# Patient Record
Sex: Male | Born: 1965 | Race: White | Hispanic: No | Marital: Married | Smoking: Former smoker
Health system: Southern US, Community
[De-identification: ages and names within clinical notes are randomized; demographics above are authoritative.]

## PROBLEM LIST (undated history)

## (undated) DIAGNOSIS — C9201 Acute myeloblastic leukemia, in remission: Secondary | ICD-10-CM

## (undated) DIAGNOSIS — D649 Anemia, unspecified: Secondary | ICD-10-CM

## (undated) DIAGNOSIS — N182 Chronic kidney disease, stage 2 (mild): Secondary | ICD-10-CM

## (undated) DIAGNOSIS — H409 Unspecified glaucoma: Secondary | ICD-10-CM

## (undated) DIAGNOSIS — I10 Essential (primary) hypertension: Secondary | ICD-10-CM

## (undated) HISTORY — PX: GLAUCOMA SURGERY: SHX656

## (undated) HISTORY — DX: Chronic kidney disease, stage 2 (mild): N18.2

## (undated) HISTORY — DX: Acute myeloblastic leukemia, in remission: C92.01

## (undated) HISTORY — DX: Essential (primary) hypertension: I10

## (undated) HISTORY — DX: Unspecified glaucoma: H40.9

---

## 2001-02-10 ENCOUNTER — Ambulatory Visit (HOSPITAL_COMMUNITY): Admission: RE | Admit: 2001-02-10 | Discharge: 2001-02-10 | Payer: Self-pay | Admitting: Family Medicine

## 2001-02-10 ENCOUNTER — Encounter: Payer: Self-pay | Admitting: Family Medicine

## 2006-05-25 ENCOUNTER — Ambulatory Visit: Payer: Self-pay | Admitting: Cardiology

## 2006-05-30 ENCOUNTER — Ambulatory Visit: Payer: Self-pay

## 2006-05-30 ENCOUNTER — Ambulatory Visit: Payer: Self-pay | Admitting: Cardiology

## 2006-06-14 ENCOUNTER — Emergency Department (HOSPITAL_COMMUNITY): Admission: EM | Admit: 2006-06-14 | Discharge: 2006-06-14 | Payer: Self-pay | Admitting: Emergency Medicine

## 2006-06-29 ENCOUNTER — Ambulatory Visit (HOSPITAL_COMMUNITY): Admission: RE | Admit: 2006-06-29 | Discharge: 2006-06-29 | Payer: Self-pay | Admitting: Family Medicine

## 2006-07-21 ENCOUNTER — Ambulatory Visit: Payer: Self-pay | Admitting: Cardiology

## 2007-11-05 IMAGING — CR DG CERVICAL SPINE COMPLETE 4+V
6 series · 6 of 6 positions shown · non-contrast
Comparison: none

HISTORY: Neck pain, chest pain, numbness tingling and pain left arm

CHEST 2 VIEWS:
No prior exam for comparison.
Normal heart size, mediastinal contours, and pulmonary vascularity.
Lungs clear.
No pleural effusion or pneumothorax.
Bones unremarkable.

[view not recorded (1 of 6)]
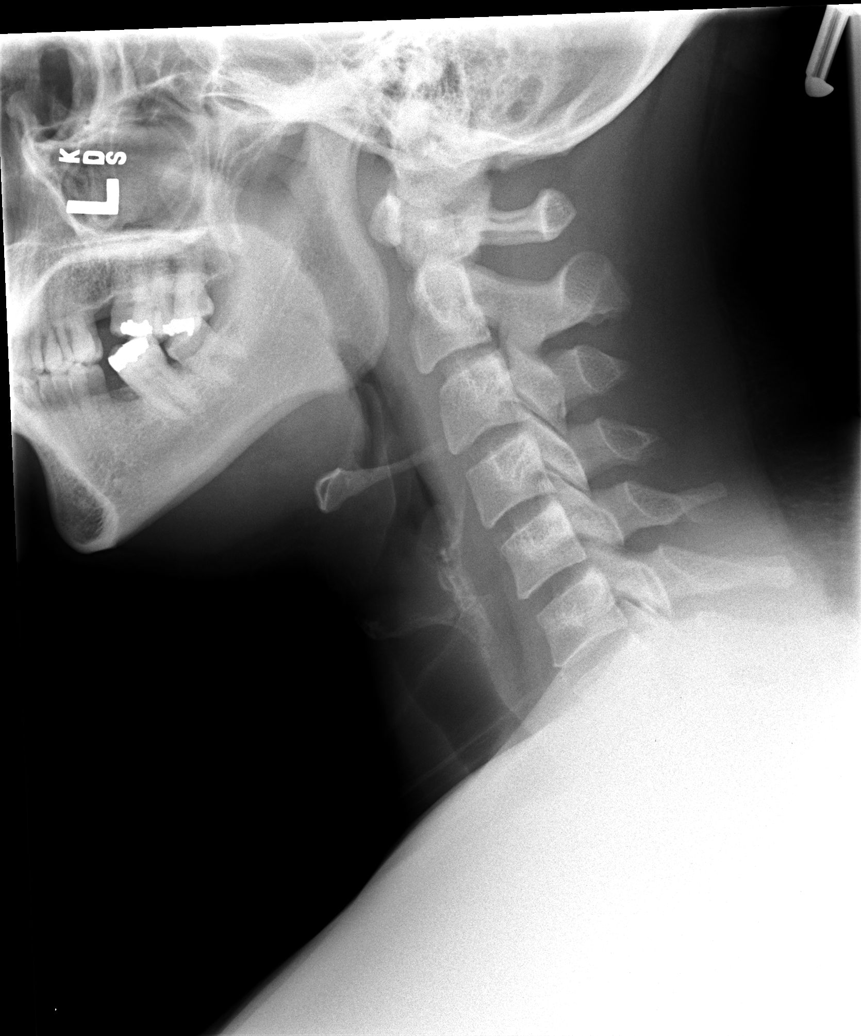

[view not recorded (2 of 6)]
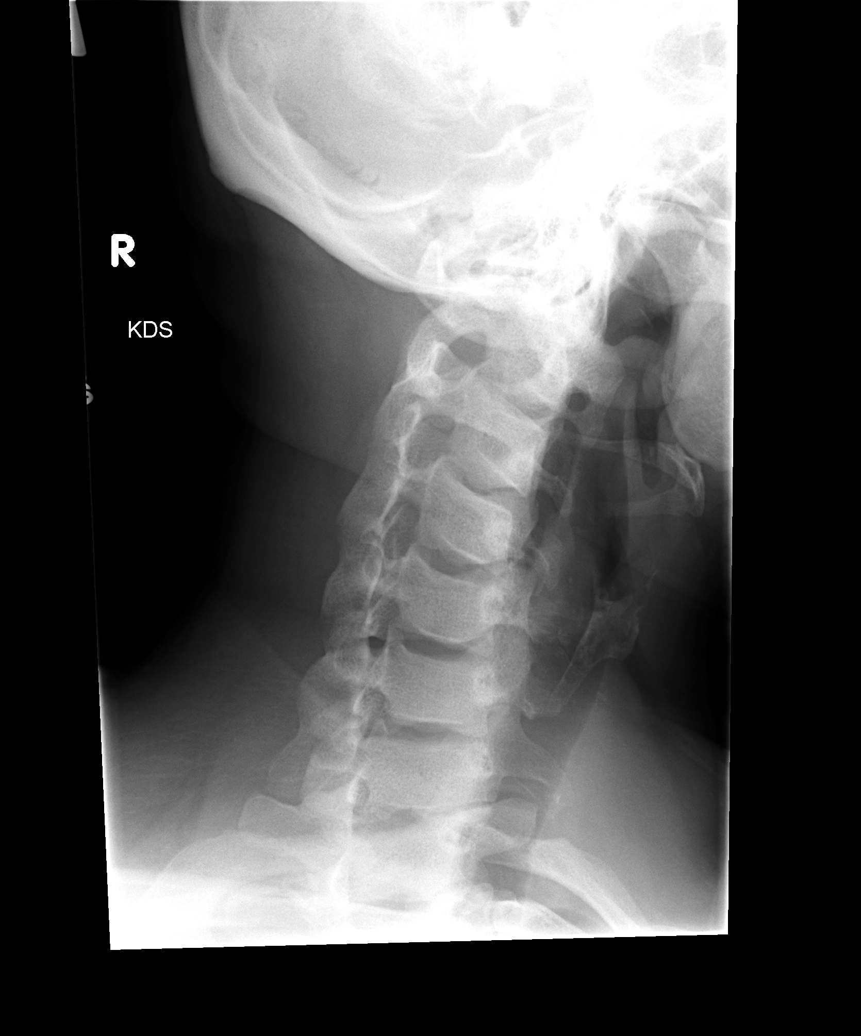

[view not recorded (3 of 6)]
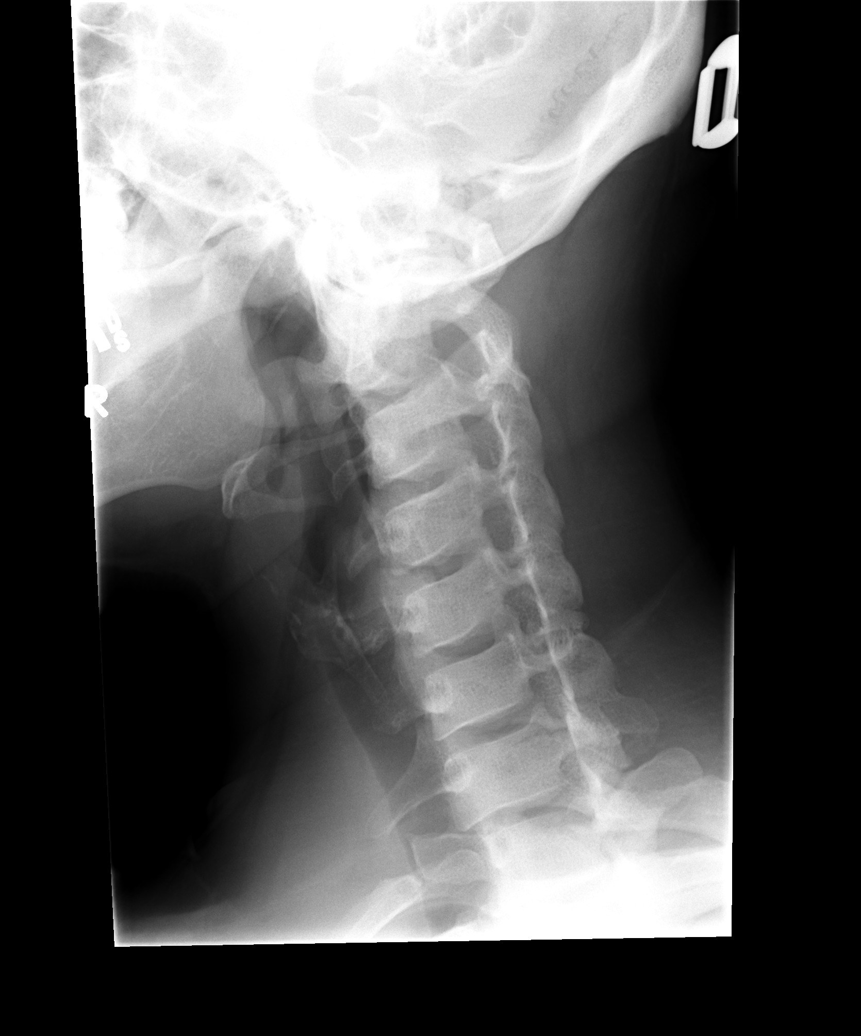

[view not recorded (4 of 6)]
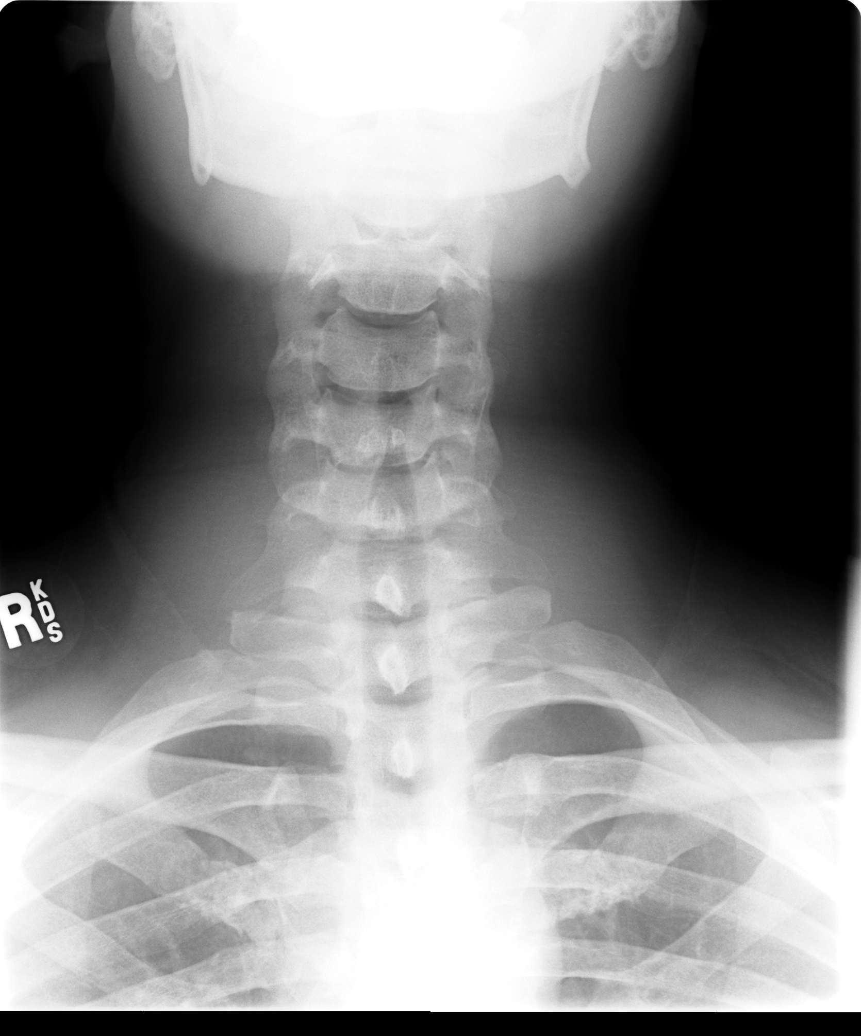

[view not recorded (5 of 6)]
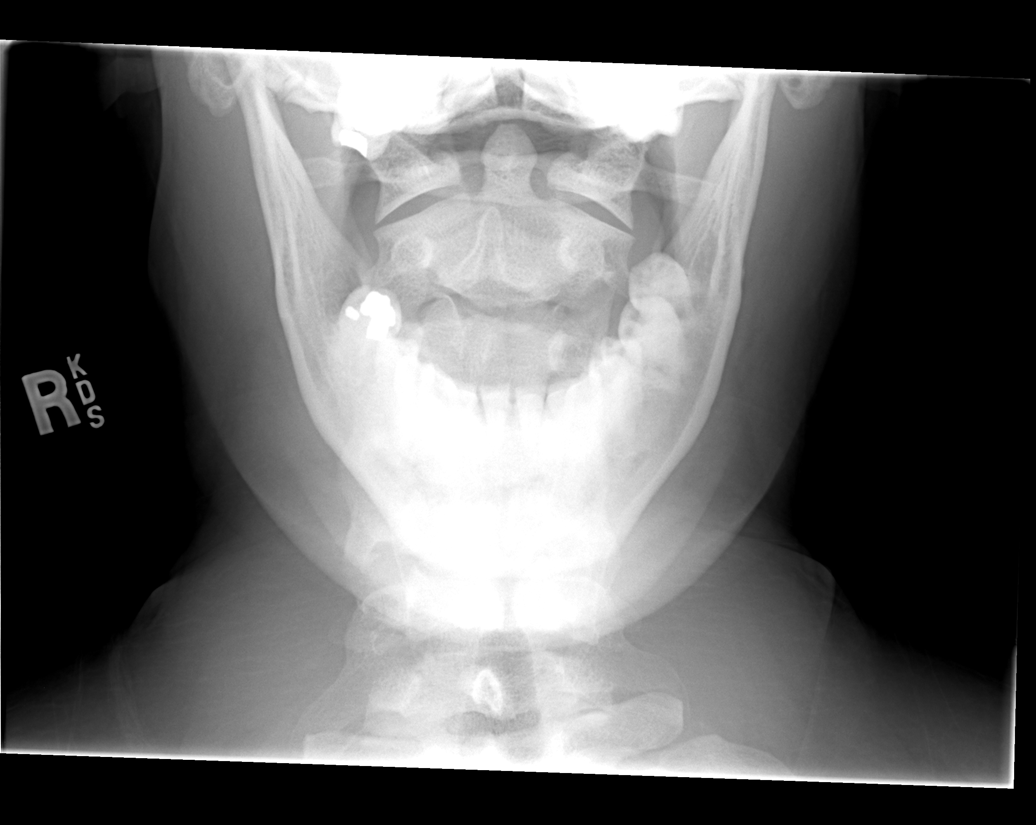

[view not recorded (6 of 6)]
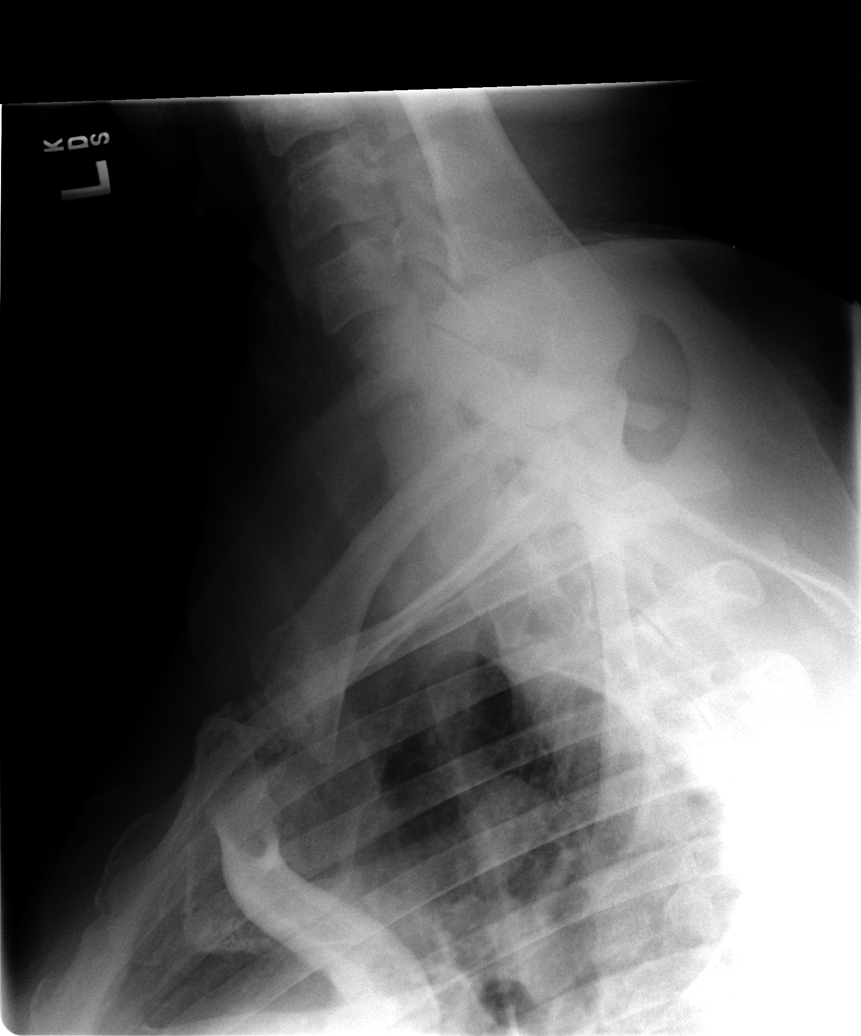

[6 of 6 positions shown; findings below may reference images not displayed]

IMPRESSION: No acute abnormalities.

CERVICAL SPINE 5 VIEWS:

Anatomic alignment without fracture or subluxation.
Vertebral body and disc space heights maintained.
Bony foramina patent.
Prevertebral soft tissues normal thickness.
C1-C2 alignment normal.
IMPRESSION: No acute abnormalities.

## 2016-09-09 DIAGNOSIS — H401132 Primary open-angle glaucoma, bilateral, moderate stage: Secondary | ICD-10-CM | POA: Diagnosis not present

## 2016-10-07 DIAGNOSIS — H40023 Open angle with borderline findings, high risk, bilateral: Secondary | ICD-10-CM | POA: Diagnosis not present

## 2016-11-04 DIAGNOSIS — H401113 Primary open-angle glaucoma, right eye, severe stage: Secondary | ICD-10-CM | POA: Diagnosis not present

## 2016-12-15 DIAGNOSIS — H401113 Primary open-angle glaucoma, right eye, severe stage: Secondary | ICD-10-CM | POA: Diagnosis not present

## 2017-01-06 DIAGNOSIS — H401113 Primary open-angle glaucoma, right eye, severe stage: Secondary | ICD-10-CM | POA: Diagnosis not present

## 2017-01-26 DIAGNOSIS — H401113 Primary open-angle glaucoma, right eye, severe stage: Secondary | ICD-10-CM | POA: Diagnosis not present

## 2017-03-03 DIAGNOSIS — H401113 Primary open-angle glaucoma, right eye, severe stage: Secondary | ICD-10-CM | POA: Diagnosis not present

## 2017-10-27 DIAGNOSIS — H401113 Primary open-angle glaucoma, right eye, severe stage: Secondary | ICD-10-CM | POA: Diagnosis not present

## 2017-10-27 DIAGNOSIS — H401121 Primary open-angle glaucoma, left eye, mild stage: Secondary | ICD-10-CM | POA: Diagnosis not present

## 2017-11-18 DIAGNOSIS — H401113 Primary open-angle glaucoma, right eye, severe stage: Secondary | ICD-10-CM | POA: Diagnosis not present

## 2017-11-18 DIAGNOSIS — H401121 Primary open-angle glaucoma, left eye, mild stage: Secondary | ICD-10-CM | POA: Diagnosis not present

## 2017-12-16 DIAGNOSIS — H401121 Primary open-angle glaucoma, left eye, mild stage: Secondary | ICD-10-CM | POA: Diagnosis not present

## 2017-12-16 DIAGNOSIS — H401113 Primary open-angle glaucoma, right eye, severe stage: Secondary | ICD-10-CM | POA: Diagnosis not present

## 2018-01-13 DIAGNOSIS — H401121 Primary open-angle glaucoma, left eye, mild stage: Secondary | ICD-10-CM | POA: Diagnosis not present

## 2018-01-13 DIAGNOSIS — H401113 Primary open-angle glaucoma, right eye, severe stage: Secondary | ICD-10-CM | POA: Diagnosis not present

## 2018-02-10 DIAGNOSIS — H401121 Primary open-angle glaucoma, left eye, mild stage: Secondary | ICD-10-CM | POA: Diagnosis not present

## 2018-02-10 DIAGNOSIS — H401113 Primary open-angle glaucoma, right eye, severe stage: Secondary | ICD-10-CM | POA: Diagnosis not present

## 2018-02-21 DIAGNOSIS — Z6841 Body Mass Index (BMI) 40.0 and over, adult: Secondary | ICD-10-CM | POA: Diagnosis not present

## 2018-02-21 DIAGNOSIS — H401113 Primary open-angle glaucoma, right eye, severe stage: Secondary | ICD-10-CM | POA: Diagnosis not present

## 2018-03-07 DIAGNOSIS — Z6841 Body Mass Index (BMI) 40.0 and over, adult: Secondary | ICD-10-CM | POA: Diagnosis not present

## 2018-03-07 DIAGNOSIS — H401113 Primary open-angle glaucoma, right eye, severe stage: Secondary | ICD-10-CM | POA: Diagnosis not present

## 2018-03-23 DIAGNOSIS — J0101 Acute recurrent maxillary sinusitis: Secondary | ICD-10-CM | POA: Diagnosis not present

## 2018-03-23 DIAGNOSIS — Z681 Body mass index (BMI) 19 or less, adult: Secondary | ICD-10-CM | POA: Diagnosis not present

## 2018-04-21 DIAGNOSIS — Z6841 Body Mass Index (BMI) 40.0 and over, adult: Secondary | ICD-10-CM | POA: Diagnosis not present

## 2018-04-21 DIAGNOSIS — J019 Acute sinusitis, unspecified: Secondary | ICD-10-CM | POA: Diagnosis not present

## 2018-04-21 DIAGNOSIS — R509 Fever, unspecified: Secondary | ICD-10-CM | POA: Diagnosis not present

## 2018-05-30 DIAGNOSIS — Z0000 Encounter for general adult medical examination without abnormal findings: Secondary | ICD-10-CM | POA: Diagnosis not present

## 2018-05-30 DIAGNOSIS — Z681 Body mass index (BMI) 19 or less, adult: Secondary | ICD-10-CM | POA: Diagnosis not present

## 2018-06-08 ENCOUNTER — Encounter: Payer: Self-pay | Admitting: Gastroenterology

## 2018-06-15 DIAGNOSIS — H401121 Primary open-angle glaucoma, left eye, mild stage: Secondary | ICD-10-CM | POA: Diagnosis not present

## 2018-06-15 DIAGNOSIS — H401113 Primary open-angle glaucoma, right eye, severe stage: Secondary | ICD-10-CM | POA: Diagnosis not present

## 2018-06-22 DIAGNOSIS — H401121 Primary open-angle glaucoma, left eye, mild stage: Secondary | ICD-10-CM | POA: Diagnosis not present

## 2018-06-22 DIAGNOSIS — H401113 Primary open-angle glaucoma, right eye, severe stage: Secondary | ICD-10-CM | POA: Diagnosis not present

## 2018-07-06 DIAGNOSIS — H401113 Primary open-angle glaucoma, right eye, severe stage: Secondary | ICD-10-CM | POA: Diagnosis not present

## 2018-07-06 DIAGNOSIS — H401121 Primary open-angle glaucoma, left eye, mild stage: Secondary | ICD-10-CM | POA: Diagnosis not present

## 2018-07-12 DIAGNOSIS — H401113 Primary open-angle glaucoma, right eye, severe stage: Secondary | ICD-10-CM | POA: Diagnosis not present

## 2018-07-12 DIAGNOSIS — H401121 Primary open-angle glaucoma, left eye, mild stage: Secondary | ICD-10-CM | POA: Diagnosis not present

## 2018-07-27 DIAGNOSIS — H401121 Primary open-angle glaucoma, left eye, mild stage: Secondary | ICD-10-CM | POA: Diagnosis not present

## 2018-07-27 DIAGNOSIS — H401113 Primary open-angle glaucoma, right eye, severe stage: Secondary | ICD-10-CM | POA: Diagnosis not present

## 2018-08-03 DIAGNOSIS — I10 Essential (primary) hypertension: Secondary | ICD-10-CM | POA: Diagnosis not present

## 2018-08-03 DIAGNOSIS — Z6841 Body Mass Index (BMI) 40.0 and over, adult: Secondary | ICD-10-CM | POA: Diagnosis not present

## 2018-08-31 DIAGNOSIS — H401113 Primary open-angle glaucoma, right eye, severe stage: Secondary | ICD-10-CM | POA: Diagnosis not present

## 2018-08-31 DIAGNOSIS — H401121 Primary open-angle glaucoma, left eye, mild stage: Secondary | ICD-10-CM | POA: Diagnosis not present

## 2018-09-13 ENCOUNTER — Encounter: Payer: Self-pay | Admitting: Nurse Practitioner

## 2018-09-13 ENCOUNTER — Ambulatory Visit (INDEPENDENT_AMBULATORY_CARE_PROVIDER_SITE_OTHER): Payer: BLUE CROSS/BLUE SHIELD | Admitting: Nurse Practitioner

## 2018-09-13 ENCOUNTER — Encounter: Payer: Self-pay | Admitting: *Deleted

## 2018-09-13 DIAGNOSIS — R195 Other fecal abnormalities: Secondary | ICD-10-CM

## 2018-09-13 DIAGNOSIS — D649 Anemia, unspecified: Secondary | ICD-10-CM | POA: Diagnosis not present

## 2018-09-13 MED ORDER — PEG 3350-KCL-NA BICARB-NACL 420 G PO SOLR: 4000.0000 mL | Freq: Once | ORAL | 0 refills | Status: AC

## 2018-09-13 NOTE — Assessment & Plan Note (Signed)
The patient did have 1 instance of very mild anemia with a hemoglobin of 12.5.  A recheck found normal.  Indices were normal as well.  He does have elevated creatinine 1.51 with diagnosed stage II chronic kidney disease.  I feels anemia is most likely due to anemia of chronic disease with kidney dysfunction.  I will check iron and ferritin today.  Given heme positive stool without observed hematochezia versus melena I will plan for colonoscopy, for which he is currently due regardless, and possible EGD.  Follow-up in 4 months.

## 2018-09-13 NOTE — Assessment & Plan Note (Signed)
Heme positive stool by primary care.  Patient denies any obvious melena or hematochezia.  Denies any GI symptoms at this time.  He is currently overdue for a colonoscopy.  I will check iron and ferritin based on 1 instance of mild anemia as per below.  We will proceed with colonoscopy and possible upper endoscopy given nonobserved heme positive stool, unsure of upper versus lower etiology.  Follow-up in 4 months.  Proceed with colonoscopy +/- EGD with Dr. Oneida Alar in the near future. The risks, benefits, and alternatives have been discussed in detail with the patient. They state understanding and desire to proceed.   The patient is not on any anticoagulants, anxiolytics, chronic pain medications, or antidepressants.  Conscious sedation should be adequate for his procedure.

## 2018-09-13 NOTE — Progress Notes (Addendum)
REVIEWED-NO ADDITIONAL RECOMMENDATIONS.  Primary Care Physician:  Denny Levy, [place] Primary Gastroenterologist:  Dr. Oneida Alar  Chief Complaint  Patient presents with  . heme+    never had tcs    HPI:   Austin Coffey is a 52 y.o. male who presents on referral from primary care for heme positive stool.  Reviewed information provided with referral including labs drawn 05/25/2018 which showed elevated creatinine of 1.51, otherwise normal CMP.  CBC with mild anemia with a hemoglobin of 12.5 which is normocytic and normochromic.  A repeat of his CBC found normal hemoglobin at 13.2.  Results of heme assure test was positive.  The patient was seen by primary care on 05/30/2018 generally no complaints at that time.  He was diagnosed with stage II chronic kidney disease.  At that time they recommended increase water, schedule colonoscopy.  Recommended follow-up c-Met in 6 months.  No history of colonoscopy in our system.  Today he states he's doing well overall. Denies noted hematochezia or melena. Denies abdominal pain, N/V, fever, chills, unintentional weight loss. Denies chest pain, dyspnea, dizziness, lightheadedness, syncope, near syncope. Denies any other upper or lower GI symptoms.  Past Medical History:  Diagnosis Date  . Chronic kidney disease, stage 2 (mild)   . Glaucoma   . Hypertension     Past Surgical History:  Procedure Laterality Date  . GLAUCOMA SURGERY      Current Outpatient Medications  Medication Sig Dispense Refill  . amLODipine (NORVASC) 5 MG tablet Take 5 mg by mouth daily.  0  . latanoprost (XALATAN) 0.005 % ophthalmic solution Place 1 drop into the left eye at bedtime.  11  . timolol (TIMOPTIC) 0.5 % ophthalmic solution Place 1 drop into the left eye 2 (two) times daily.    . polyethylene glycol-electrolytes (NULYTELY/GOLYTELY) 420 g solution Take 4,000 mLs by mouth once for 1 dose. 4000 mL 0   No current facility-administered medications for this visit.      Allergies as of 09/13/2018  . (No Known Allergies)    Family History  Problem Relation Age of Onset  . Colon cancer Neg Hx   . Gastric cancer Neg Hx   . Esophageal cancer Neg Hx     Social History   Socioeconomic History  . Marital status: Married    Spouse name: Not on file  . Number of children: Not on file  . Years of education: Not on file  . Highest education level: Not on file  Occupational History  . Not on file  Social Needs  . Financial resource strain: Not on file  . Food insecurity:    Worry: Not on file    Inability: Not on file  . Transportation needs:    Medical: Not on file    Non-medical: Not on file  Tobacco Use  . Smoking status: Former Research scientist (life sciences)  . Smokeless tobacco: Never Used  Substance and Sexual Activity  . Alcohol use: Not Currently  . Drug use: Never  . Sexual activity: Not on file  Lifestyle  . Physical activity:    Days per week: Not on file    Minutes per session: Not on file  . Stress: Not on file  Relationships  . Social connections:    Talks on phone: Not on file    Gets together: Not on file    Attends religious service: Not on file    Active member of club or organization: Not on file    Attends meetings of  clubs or organizations: Not on file    Relationship status: Not on file  . Intimate partner violence:    Fear of current or ex partner: Not on file    Emotionally abused: Not on file    Physically abused: Not on file    Forced sexual activity: Not on file  Other Topics Concern  . Not on file  Social History Narrative  . Not on file    Review of Systems: Complete ROS negative except as per HPI.    Physical Exam: BP (!) 159/94   Pulse 77   Temp 98.1 F (36.7 C) (Oral)   Ht 6' 2"  (1.88 m)   Wt (!) 339 lb 9.6 oz (154 kg)   BMI 43.60 kg/m  General:   Morbidly obese male. Alert and oriented. Pleasant and cooperative. Well-nourished and well-developed.  Eyes:  Without icterus, sclera clear and conjunctiva pink.   Ears:  Normal auditory acuity. Cardiovascular:  S1, S2 present without murmurs appreciated. Extremities without clubbing or edema. Respiratory:  Clear to auscultation bilaterally. No wheezes, rales, or rhonchi. No distress.  Gastrointestinal:  +BS, obese but soft, non-tender and non-distended. No HSM noted. No guarding or rebound. No masses appreciated.  Rectal:  Deferred  Musculoskalatal:  Symmetrical without gross deformities. Neurologic:  Alert and oriented x4;  grossly normal neurologically. Psych:  Alert and cooperative. Normal mood and affect. Heme/Lymph/Immune: No excessive bruising noted.    09/13/2018 10:40 AM   Disclaimer: This note was dictated with voice recognition software. Similar sounding words can inadvertently be transcribed and may not be corrected upon review.

## 2018-09-13 NOTE — Progress Notes (Signed)
CC'D TO PCP °

## 2018-09-13 NOTE — Patient Instructions (Addendum)
1. We will schedule your procedures for you. 2. Have your labs drawn when you are able to. 3. Return for follow-up in 4 months. 4. Call us if you have any questions or concerns.  At [facility] we value your feedback. You may receive a survey about your visit today. Please share your experience as we strive to create trusting relationships with our patients to provide genuine, compassionate, quality care.  We appreciate your understanding and patience as we review any laboratory studies, imaging, and other diagnostic tests that are ordered as we care for you. Our office policy is 5 business days for review of these results, and any emergent or urgent results are addressed in a timely manner for your best interest. If you do not hear from our office in 1 week, please contact us.   We also encourage the use of MyChart, which contains your medical information for your review as well. If you are not enrolled in this feature, an access code is on this after visit summary for your convenience. Thank you for allowing [place] to be involved in your care.  It was great to see you today!  I hope you have a Merry Christmas!!

## 2018-09-28 DIAGNOSIS — H401121 Primary open-angle glaucoma, left eye, mild stage: Secondary | ICD-10-CM | POA: Diagnosis not present

## 2018-09-28 DIAGNOSIS — H401113 Primary open-angle glaucoma, right eye, severe stage: Secondary | ICD-10-CM | POA: Diagnosis not present

## 2018-11-06 ENCOUNTER — Encounter (HOSPITAL_COMMUNITY): Payer: Self-pay

## 2018-11-06 ENCOUNTER — Ambulatory Visit (HOSPITAL_COMMUNITY)
Admission: RE | Admit: 2018-11-06 | Discharge: 2018-11-06 | Disposition: A | Discharge status: Home / Self Care | Payer: BLUE CROSS/BLUE SHIELD | Source: Ambulatory Visit | Attending: Gastroenterology | Admitting: Gastroenterology

## 2018-11-06 ENCOUNTER — Encounter (HOSPITAL_COMMUNITY)
Admission: RE | Disposition: A | Discharge status: Home / Self Care | Payer: Self-pay | Source: Ambulatory Visit | Attending: Gastroenterology

## 2018-11-06 ENCOUNTER — Other Ambulatory Visit: Payer: Self-pay

## 2018-11-06 DIAGNOSIS — H409 Unspecified glaucoma: Secondary | ICD-10-CM | POA: Diagnosis not present

## 2018-11-06 DIAGNOSIS — I129 Hypertensive chronic kidney disease with stage 1 through stage 4 chronic kidney disease, or unspecified chronic kidney disease: Secondary | ICD-10-CM | POA: Diagnosis not present

## 2018-11-06 DIAGNOSIS — D122 Benign neoplasm of ascending colon: Secondary | ICD-10-CM | POA: Diagnosis not present

## 2018-11-06 DIAGNOSIS — R195 Other fecal abnormalities: Secondary | ICD-10-CM | POA: Insufficient documentation

## 2018-11-06 DIAGNOSIS — K648 Other hemorrhoids: Secondary | ICD-10-CM | POA: Insufficient documentation

## 2018-11-06 DIAGNOSIS — D649 Anemia, unspecified: Secondary | ICD-10-CM | POA: Insufficient documentation

## 2018-11-06 DIAGNOSIS — K2970 Gastritis, unspecified, without bleeding: Secondary | ICD-10-CM | POA: Diagnosis not present

## 2018-11-06 DIAGNOSIS — K621 Rectal polyp: Secondary | ICD-10-CM | POA: Diagnosis not present

## 2018-11-06 DIAGNOSIS — K2950 Unspecified chronic gastritis without bleeding: Secondary | ICD-10-CM | POA: Insufficient documentation

## 2018-11-06 DIAGNOSIS — D123 Benign neoplasm of transverse colon: Secondary | ICD-10-CM | POA: Diagnosis not present

## 2018-11-06 DIAGNOSIS — Z87891 Personal history of nicotine dependence: Secondary | ICD-10-CM | POA: Insufficient documentation

## 2018-11-06 DIAGNOSIS — N182 Chronic kidney disease, stage 2 (mild): Secondary | ICD-10-CM | POA: Insufficient documentation

## 2018-11-06 DIAGNOSIS — Q438 Other specified congenital malformations of intestine: Secondary | ICD-10-CM | POA: Insufficient documentation

## 2018-11-06 DIAGNOSIS — K2951 Unspecified chronic gastritis with bleeding: Secondary | ICD-10-CM | POA: Diagnosis not present

## 2018-11-06 DIAGNOSIS — Z79899 Other long term (current) drug therapy: Secondary | ICD-10-CM | POA: Insufficient documentation

## 2018-11-06 DIAGNOSIS — D128 Benign neoplasm of rectum: Secondary | ICD-10-CM | POA: Diagnosis not present

## 2018-11-06 HISTORY — DX: Anemia, unspecified: D64.9

## 2018-11-06 HISTORY — PX: COLONOSCOPY: SHX5424

## 2018-11-06 HISTORY — PX: ESOPHAGOGASTRODUODENOSCOPY: SHX5428

## 2018-11-06 HISTORY — PX: POLYPECTOMY: SHX5525

## 2018-11-06 SURGERY — COLONOSCOPY
Anesthesia: Moderate Sedation

## 2018-11-06 MED ORDER — MIDAZOLAM HCL 5 MG/5ML IJ SOLN
INTRAMUSCULAR | Status: DC | PRN
  Administered 2018-11-06: 3 mg via INTRAVENOUS
  Administered 2018-11-06: 1 mg via INTRAVENOUS
  Administered 2018-11-06: 2 mg via INTRAVENOUS

## 2018-11-06 MED ORDER — MEPERIDINE HCL 100 MG/ML IJ SOLN
INTRAMUSCULAR | Status: DC | PRN
  Administered 2018-11-06 (×2): 50 mg

## 2018-11-06 MED ORDER — SODIUM CHLORIDE 0.9 % IV SOLN
INTRAVENOUS | Status: DC
  Administered 2018-11-06: 13:00:00 via INTRAVENOUS

## 2018-11-06 MED ORDER — MEPERIDINE HCL 100 MG/ML IJ SOLN
INTRAMUSCULAR | Status: AC
  Filled 2018-11-06: qty 2

## 2018-11-06 MED ORDER — LIDOCAINE VISCOUS HCL 2 % MT SOLN
OROMUCOSAL | Status: AC
  Filled 2018-11-06: qty 15

## 2018-11-06 MED ORDER — MIDAZOLAM HCL 5 MG/5ML IJ SOLN
INTRAMUSCULAR | Status: AC
  Filled 2018-11-06: qty 10

## 2018-11-06 MED ORDER — LIDOCAINE VISCOUS HCL 2 % MT SOLN
OROMUCOSAL | Status: DC | PRN
  Administered 2018-11-06: 1 via OROMUCOSAL

## 2018-11-06 MED ORDER — STERILE WATER FOR IRRIGATION IR SOLN
Status: DC | PRN
  Administered 2018-11-06: 15:00:00

## 2018-11-06 NOTE — Op Note (Signed)
[facility] Patient Name: Austin Coffey Procedure Date: 11/06/2018 2:38 PM MRN: [id number] Date of Birth: 1966-04-17 Attending MD: Barney Drain MD, MD CSN: [id number] Age: 53 Admit Type: Outpatient Procedure:                Upper GI endoscopy WITH COLD FORCEPS BIOPSY Indications:              Heme positive stool, Anemia Providers:                Barney Drain MD, MD, Janeece Riggers, RN, Tammy Vaught,                            RN, Gerome Sam, RN Referring MD:             Denny Levy Medicines:                TCS + Midazolam 1 mg IV Complications:            No immediate complications. Estimated Blood Loss:     Estimated blood loss was minimal. Procedure:                Pre-Anesthesia Assessment:                           - Prior to the procedure, a History and Physical                            was performed, and patient medications and                            allergies were reviewed. The patient's tolerance of                            previous anesthesia was also reviewed. The risks                            and benefits of the procedure and the sedation                            options and risks were discussed with the patient.                            All questions were answered, and informed consent                            was obtained. Prior Anticoagulants: The patient has                            taken no previous anticoagulant or antiplatelet                            agents. ASA Grade Assessment: II - A patient with                            mild systemic disease. After reviewing the risks  and benefits, the patient was deemed in                            satisfactory condition to undergo the procedure.                            After obtaining informed consent, the endoscope was                            passed under direct vision. Throughout the                            procedure, the patient's blood pressure, pulse,  and                            oxygen saturations were monitored continuously. The                            [id number]-H190 ([id number]) scope was introduced through the                            mouth, and advanced to the second part of duodenum.                            The upper GI endoscopy was accomplished without                            difficulty. The patient tolerated the procedure                            well. Scope In: 3:38:58 PM Scope Out: 3:48:32 PM Total Procedure Duration: 0 hours 9 minutes 34 seconds  Findings:      The examined esophagus was normal.      Localized mild inflammation characterized by congestion (edema) and       erythema was found in the entire examined stomach. Biopsies(2: BODY, 3:       ANTRUM) were taken with a cold forceps for Helicobacter pylori testing.      The duodenal bulb was normal. Biopsies(2) for histology were taken with       a cold forceps for evaluation of celiac disease.      The second portion of the duodenum was normal. Biopsies(4) for histology       were taken with a cold forceps for evaluation of celiac disease. Impression:               - NO SOURCE FOR ANEMIA IDENTIFIED. ANEMIS IS MOST                            LIKELY DUE TO CHRONIC RENAL DISEASE. HEME POSITIVE                            STOOL DUE TO GASTRITIS, POLYPS, AND HEMORRHOIDS.                           - MILD  Gastritis. Moderate Sedation:      Moderate (conscious) sedation was administered by the endoscopy nurse       and supervised by the endoscopist. The following parameters were       monitored: oxygen saturation, heart rate, blood pressure, and response       to care. Total physician intraservice time was 49 minutes. Recommendation:           - Patient has a contact number available for                            emergencies. The signs and symptoms of potential                            delayed complications were discussed with the                             patient. Return to normal activities tomorrow.                            Written discharge instructions were provided to the                            patient.                           - High fiber diet.                           - Continue present medications.                           - Await pathology results.                           - Return to my office in 6 months. Procedure Code(s):        --- Professional ---                           [id number], Esophagogastroduodenoscopy, flexible,                            transoral; with biopsy, single or multiple                           99153, Moderate sedation; each additional 15                            minutes intraservice time                           99153, Moderate sedation; each additional 15                            minutes intraservice time                           G0500, Moderate sedation services provided by the  same physician or other qualified health care                            professional performing a gastrointestinal                            endoscopic service that sedation supports,                            requiring the presence of an independent trained                            observer to assist in the monitoring of the                            patient's level of consciousness and physiological                            status; initial 15 minutes of intra-service time;                            patient age 22 years or older (additional time may                            be reported with [phone], as appropriate) Diagnosis Code(s):        --- Professional ---                           K29.70, Gastritis, unspecified, without bleeding                           R19.5, Other fecal abnormalities                           D64.9, Anemia, unspecified CPT copyright 2018 American Medical Association. All rights reserved. The codes documented in this report are preliminary and upon coder  review may  be revised to meet current compliance requirements. Barney Drain, MD Barney Drain MD, MD 11/06/2018 4:16:21 PM This report has been signed electronically. Number of Addenda: 0

## 2018-11-06 NOTE — Op Note (Signed)
[facility] Patient Name: Austin Coffey Procedure Date: 11/06/2018 2:29 PM MRN: [id number] Date of Birth: [birth year] Attending MD: Barney Drain MD, MD CSN: [id number] Age: 53 Admit Type: Outpatient Procedure:                Colonoscopy WITH COLD SNARE/FORCEPS POLYPECTOMY Indications:              Heme positive stool, Anemia Providers:                Barney Drain MD, MD, Janeece Riggers, RN, Tammy Vaught,                            RN, Gerome Sam, RN Referring MD:             Denny Levy Medicines:                Meperidine 100 mg IV, Midazolam 5 mg IV Complications:            No immediate complications. Estimated Blood Loss:     Estimated blood loss was minimal. Procedure:                Pre-Anesthesia Assessment:                           - Prior to the procedure, a History and Physical                            was performed, and patient medications and                            allergies were reviewed. The patient's tolerance of                            previous anesthesia was also reviewed. The risks                            and benefits of the procedure and the sedation                            options and risks were discussed with the patient.                            All questions were answered, and informed consent                            was obtained. Prior Anticoagulants: The patient has                            taken no previous anticoagulant or antiplatelet                            agents. ASA Grade Assessment: II - A patient with                            mild systemic disease. After reviewing the risks  and benefits, the patient was deemed in                            satisfactory condition to undergo the procedure.                            After obtaining informed consent, the colonoscope                            was passed under direct vision. Throughout the                            procedure, the patient's  blood pressure, pulse, and                            oxygen saturations were monitored continuously. The                            [id number]-[id number] ([id number]) scope was introduced through                            the anus and advanced to the 6 cm into the ileum.                            The colonoscopy was somewhat difficult due to a                            tortuous colon. Successful completion of the                            procedure was aided by straightening and shortening                            the scope to obtain bowel loop reduction and                            COLOWRAP. The patient tolerated the procedure well.                            The quality of the bowel preparation was good. The                            terminal ileum, ileocecal valve, appendiceal                            orifice, and rectum were photographed. Scope In: 2:58:14 PM Scope Out: 3:31:45 PM Scope Withdrawal Time: 0 hours 29 minutes 14 seconds  Total Procedure Duration: 0 hours 33 minutes 31 seconds  Findings:      The terminal ileum appeared normal.      Two sessile polyps were found in the hepatic flexure and ascending       colon. The polyps were 3 to 6 mm in size. These polyps were removed with       a  cold snare. Resection and retrieval were complete.      A 3 mm polyp was found in the rectum. The polyp was sessile. The polyp       was removed with a cold biopsy forceps. Resection and retrieval were       complete.      Internal hemorrhoids were found. The hemorrhoids were small.      The recto-sigmoid colon, sigmoid colon and descending colon were       moderately tortuous. Impression:               - The examined portion of the ileum was normal.                           - Two 3 to 6 mm polyps at the hepatic flexure and                            in the ascending colon, removed with a cold snare.                            Resected and retrieved.                           - One 3 mm polyp  in the rectum, removed with a cold                            biopsy forceps. Resected and retrieved.                           - Internal hemorrhoids.                           - Tortuous LEFT colon. Moderate Sedation:      Moderate (conscious) sedation was administered by the endoscopy nurse       and supervised by the endoscopist. The following parameters were       monitored: oxygen saturation, heart rate, blood pressure, and response       to care. Total physician intraservice time was 49 minutes. Recommendation:           - Patient has a contact number available for                            emergencies. The signs and symptoms of potential                            delayed complications were discussed with the                            patient. Return to normal activities tomorrow.                            Written discharge instructions were provided to the                            patient.                           -  High fiber diet.                           - Continue present medications.                           - Await pathology results.                           - Repeat colonoscopy in 5-10 years for surveillance. Procedure Code(s):        --- Professional ---                           [phone], Colonoscopy, flexible; with removal of                            tumor(s), polyp(s), or other lesion(s) by snare                            technique                           45380, 59, Colonoscopy, flexible; with biopsy,                            single or multiple                           99153, Moderate sedation; each additional 15                            minutes intraservice time                           99153, Moderate sedation; each additional 15                            minutes intraservice time                           G0500, Moderate sedation services provided by the                            same physician or other qualified health care                             professional performing a gastrointestinal                            endoscopic service that sedation supports,                            requiring the presence of an independent trained                            observer to assist in the monitoring of the  patient's level of consciousness and physiological                            status; initial 15 minutes of intra-service time;                            patient age 23 years or older (additional time may                            be reported with [phone], as appropriate) Diagnosis Code(s):        --- Professional ---                           K64.8, Other hemorrhoids                           D12.3, Benign neoplasm of transverse colon (hepatic                            flexure or splenic flexure)                           D12.2, Benign neoplasm of ascending colon                           K62.1, Rectal polyp                           R19.5, Other fecal abnormalities                           D64.9, Anemia, unspecified                           Q43.8, Other specified congenital malformations of                            intestine CPT copyright 2018 American Medical Association. All rights reserved. The codes documented in this report are preliminary and upon coder review may  be revised to meet current compliance requirements. Barney Drain, MD Barney Drain MD, MD 11/06/2018 4:11:40 PM This report has been signed electronically. Number of Addenda: 0

## 2018-11-06 NOTE — Discharge Instructions (Signed)
NO OBVIOUS SOURCE FOR YOUR LOW BLOOD COUNT WAS IDENTIFIED.  THE BLOOD DETECTED IN YOUR STOOL WAS MOST LIKELY DUE TO POLYPS & HEMORRHOIDS. IT IS MOST LIKELY DUE TO YOUR KIDNEY DISEASE. You have internal hemorrhoids, and HAD 3 small polyps removed. You have mild gastritis.I biopsied your stomach and SMALL BOWEL.   DRINK WATER TO KEEP YOUR URINE LIGHT YELLOW.  CONTINUE YOUR WEIGHT LOSS EFFORTS. YOUR BODY MASS INDEX IS OVER 40 WHICH MEANS YOU ARE MORBIDLY OBESE. OBESITY IS ASSOCIATED WITH AN INCREASED FOR CIRRHOSIS AND ALL CANCERS, INCLUDING ESOPHAGEAL AND COLON CANCER. A WEIGHT OF 305 LBS OR LESS WILL GET YOUR BODY MASS INDEX(BMI) UNDER 40 BUT YOUR BODY MASS INDEX WILL STILL BE OVER 30 WHICH MEANS YOU ARE OBESE.  A WEIGHT OF 230 LBS OR LESS  WILL GET YOUR BODY MASS INDEX(BMI) UNDER 30.  FOLLOW A HIGH FIBER/LOW FAT DIET. AVOID ITEMS THAT CAUSE BLOATING. SEE INFO BELOW.   YOUR BIOPSY RESULTS WILL BE BACK IN 5 BUSINESS DAYS.  FOLLOW UP IN 6 MOS.   Next colonoscopy in 5-10 years.   ENDOSCOPY Care After Read the instructions outlined below and refer to this sheet in the next week. These discharge instructions provide you with general information on caring for yourself after you leave the hospital. While your treatment has been planned according to the most current medical practices available, unavoidable complications occasionally occur. If you have any problems or questions after discharge, call DR. Veera Stapleton, [phone].  ACTIVITY  You may resume your regular activity, but move at a slower pace for the next 24 hours.   Take frequent rest periods for the next 24 hours.   Walking will help get rid of the air and reduce the bloated feeling in your belly (abdomen).   No driving for 24 hours (because of the medicine (anesthesia) used during the test).   You may shower.   Do not sign any important legal documents or operate any machinery for 24 hours (because of the anesthesia used during the  test).    NUTRITION  Drink plenty of fluids.   You may resume your normal diet as instructed by your doctor.   Begin with a light meal and progress to your normal diet. Heavy or fried foods are harder to digest and may make you feel sick to your stomach (nauseated).   Avoid alcoholic beverages for 24 hours or as instructed.    MEDICATIONS  You may resume your normal medications.   WHAT YOU CAN EXPECT TODAY  Some feelings of bloating in the abdomen.   Passage of more gas than usual.   Spotting of blood in your stool or on the toilet paper  .  IF YOU HAD POLYPS REMOVED DURING THE ENDOSCOPY:  Eat a soft diet IF YOU HAVE NAUSEA, BLOATING, ABDOMINAL PAIN, OR VOMITING.    FINDING OUT THE RESULTS OF YOUR TEST Not all test results are available during your visit. DR. Oneida Alar WILL CALL YOU WITHIN 7 DAYS OF YOUR PROCEDUE WITH YOUR RESULTS. Do not assume everything is normal if you have not heard from DR. Travelle Mcclimans IN ONE WEEK, CALL HER OFFICE AT [phone].  SEEK IMMEDIATE MEDICAL ATTENTION AND CALL THE OFFICE: [phone] IF:  You have more than a spotting of blood in your stool.   Your belly is swollen (abdominal distention).   You are nauseated or vomiting.   You have a temperature over 101F.   You have abdominal pain or discomfort that is severe or gets worse  throughout the day.  Polyps, Colon  A polyp is extra tissue that grows inside your body. Colon polyps grow in the large intestine. The large intestine, also called the colon, is part of your digestive system. It is a long, hollow tube at the end of your digestive tract where your body makes and stores stool. Most polyps are not dangerous. They are benign. This means they are not cancerous. But over time, some types of polyps can turn into cancer. Polyps that are smaller than a pea are usually not harmful. But larger polyps could someday become or may already be cancerous. To be safe, doctors remove all polyps and test  them.   WHO GETS POLYPS? Anyone can get polyps, but certain people are more likely than others. You may have a greater chance of getting polyps if:  You are over 50.   You have had polyps before.   Someone in your family has had polyps.   Someone in your family has had cancer of the large intestine.   Find out if someone in your family has had polyps. You may also be more likely to get polyps if you:   Eat a lot of fatty foods   Smoke   Drink alcohol   Do not exercise  Eat too much   PREVENTION There is not one sure way to prevent polyps. You might be able to lower your risk of getting them if you:  Eat more fruits and vegetables and less fatty food.   Do not smoke.   Avoid alcohol.   Exercise every day.   Lose weight if you are overweight.   Eating more calcium and folate can also lower your risk of getting polyps. Some foods that are rich in calcium are milk, cheese, and broccoli. Some foods that are rich in folate are chickpeas, kidney beans, and spinach.    Gastritis  Gastritis is an inflammation (the body's way of reacting to injury and/or infection) of the stomach. It is often caused by viral or bacterial (germ) infections. It can also be caused BY ASPIRIN, BC/GOODY POWDER'S, (IBUPROFEN) MOTRIN, OR ALEVE (NAPROXEN), chemicals (including alcohol), SPICY FOODS, and medications. This illness may be associated with generalized malaise (feeling tired, not well), UPPER ABDOMINAL STOMACH cramps, and fever. One common bacterial cause of gastritis is an organism known as H. Pylori. This can be treated with antibiotics.    High-Fiber Diet A high-fiber diet changes your normal diet to include more whole grains, legumes, fruits, and vegetables. Changes in the diet involve replacing refined carbohydrates with unrefined foods. The calorie level of the diet is essentially unchanged. The Dietary Reference Intake (recommended amount) for adult males is 38 grams per day. For adult  females, it is 25 grams per day. Pregnant and lactating women should consume 28 grams of fiber per day. Fiber is the intact part of a plant that is not broken down during digestion. Functional fiber is fiber that has been isolated from the plant to provide a beneficial effect in the body.  PURPOSE  Increase stool bulk.   Ease and regulate bowel movements.   Lower cholesterol.   REDUCE RISK OF COLON CANCER  INDICATIONS THAT YOU NEED MORE FIBER  Constipation and hemorrhoids.   Uncomplicated diverticulosis (intestine condition) and irritable bowel syndrome.   Weight management.   As a protective measure against hardening of the arteries (atherosclerosis), diabetes, and cancer.   GUIDELINES FOR INCREASING FIBER IN THE DIET  Start adding fiber to the diet  slowly. A gradual increase of about 5 more grams (2 slices of whole-wheat bread, 2 servings of most fruits or vegetables, or 1 bowl of high-fiber cereal) per day is best. Too rapid an increase in fiber may result in constipation, flatulence, and bloating.   Drink enough water and fluids to keep your urine clear or pale yellow. Water, juice, or caffeine-free drinks are recommended. Not drinking enough fluid may cause constipation.   Eat a variety of high-fiber foods rather than one type of fiber.   Try to increase your intake of fiber through using high-fiber foods rather than fiber pills or supplements that contain small amounts of fiber.   The goal is to change the types of food eaten. Do not supplement your present diet with high-fiber foods, but replace foods in your present diet.    INCLUDE A VARIETY OF FIBER SOURCES  Replace refined and processed grains with whole grains, canned fruits with fresh fruits, and incorporate other fiber sources. White rice, white breads, and most bakery goods contain little or no fiber.   Brown whole-grain rice, buckwheat oats, and many fruits and vegetables are all good sources of fiber. These  include: broccoli, Brussels sprouts, cabbage, cauliflower, beets, sweet potatoes, white potatoes (skin on), carrots, tomatoes, eggplant, squash, berries, fresh fruits, and dried fruits.   Cereals appear to be the richest source of fiber. Cereal fiber is found in whole grains and bran. Bran is the fiber-rich outer coat of cereal grain, which is largely removed in refining. In whole-grain cereals, the bran remains. In breakfast cereals, the largest amount of fiber is found in those with "bran" in their names. The fiber content is sometimes indicated on the label.   You may need to include additional fruits and vegetables each day.   In baking, for 1 cup white flour, you may use the following substitutions:   1 cup whole-wheat flour minus 2 tablespoons.   1/2 cup white flour plus 1/2 cup whole-wheat flour.

## 2018-11-06 NOTE — H&P (Addendum)
Primary Care Physician:  Denny Levy, [place] Primary Gastroenterologist:  Dr. Oneida Alar  Pre-Procedure History & Physical: HPI:  Austin Coffey is a 53 y.o. male here for  ANEMIA.  Past Medical History:  Diagnosis Date  . Anemia   . Chronic kidney disease, stage 2 (mild)   . Glaucoma   . Hypertension     Past Surgical History:  Procedure Laterality Date  . GLAUCOMA SURGERY      Prior to Admission medications   Medication Sig Start Date End Date Taking? Authorizing Provider  amLODipine (NORVASC) 5 MG tablet Take 5 mg by mouth daily. 08/29/18  Yes [provider]  guaiFENesin (MUCINEX) 600 MG 12 hr tablet Take 1,200 mg by mouth 2 (two) times daily as needed (sinus pressure).    [provider]  latanoprost (XALATAN) 0.005 % ophthalmic solution Place 1 drop into the left eye at bedtime. 08/31/18   [provider]  timolol (TIMOPTIC) 0.5 % ophthalmic solution Place 1 drop into the left eye 2 (two) times daily. 07/27/18 07/27/19  [provider]    Allergies as of 09/13/2018  . (No Known Allergies)    Family History  Problem Relation Age of Onset  . Breast cancer Mother   . Colon polyps Father        AGE 30-75  . Colon cancer Neg Hx   . Gastric cancer Neg Hx   . Esophageal cancer Neg Hx     Social History   Socioeconomic History  . Marital status: Married    Spouse name: Not on file  . Number of children: Not on file  . Years of education: Not on file  . Highest education level: Not on file  Occupational History  . Not on file  Social Needs  . Financial resource strain: Not on file  . Food insecurity:    Worry: Not on file    Inability: Not on file  . Transportation needs:    Medical: Not on file    Non-medical: Not on file  Tobacco Use  . Smoking status: Former Research scientist (life sciences)  . Smokeless tobacco: Never Used  Substance and Sexual Activity  . Alcohol use: Not Currently  . Drug use: Never  . Sexual activity: Not on file   Lifestyle  . Physical activity:    Days per week: Not on file    Minutes per session: Not on file  . Stress: Not on file  Relationships  . Social connections:    Talks on phone: Not on file    Gets together: Not on file    Attends religious service: Not on file    Active member of club or organization: Not on file    Attends meetings of clubs or organizations: Not on file    Relationship status: Not on file  . Intimate partner violence:    Fear of current or ex partner: Not on file    Emotionally abused: Not on file    Physically abused: Not on file    Forced sexual activity: Not on file  Other Topics Concern  . 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    Review of Systems: See HPI, otherwise negative ROS   Physical Exam: BP (!) 158/71   Pulse 86   Temp 99.3 F (37.4 C) (Oral)   Resp 17   Ht 6\' 2"  (1.88 m)   Wt (!) 151 kg   SpO2 98%  BMI 42.75 kg/m  General:   Alert,  pleasant and cooperative in NAD Head:  Normocephalic and atraumatic. Neck:  Supple; Lungs:  Clear throughout to auscultation.    Heart:  Regular rate and rhythm. Abdomen:  Soft, nontender and nondistended. Normal bowel sounds, without guarding, and without rebound.   Neurologic:  Alert and  oriented x4;  grossly normal neurologically.  Impression/Plan:     ANEMIA.  Plan:  1. TCS/?EGD TODAY DISCUSSED PROCEDURE, BENEFITS, & RISKS: < 1% chance of medication reaction, bleeding, perforation, or rupture of spleen/liver.

## 2018-11-09 ENCOUNTER — Encounter (HOSPITAL_COMMUNITY): Payer: Self-pay | Admitting: Gastroenterology

## 2018-11-10 ENCOUNTER — Telehealth: Payer: Self-pay | Admitting: Gastroenterology

## 2018-11-10 NOTE — Telephone Encounter (Signed)
Please call pt. He had 3 simple adenomas removed. His stomach Bx shows mild ATROPHIC gastritis. ATROPHIC GASTRITIS MEANS THE ACID CELLS IN HIS STOMACH ARE NOT PRODUCING ENOUGH ACID, WHICH CAN LEAD TO LOW BLOOD COUNT.  THE SOURCE FOR YOUR LOW BLOOD COUNT IS POLYPS &MILD  ATROPHIC GASTRITIS IN THE SETTING OF YOUR KIDNEY DISEASE.   DRINK WATER TO KEEP YOUR URINE LIGHT YELLOW.  CONTINUE YOUR WEIGHT LOSS EFFORTS. A WEIGHT OF 305 LBS OR LESS WILL GET YOUR BODY MASS INDEX(BMI) UNDER 40 BUT   A WEIGHT OF 230 LBS OR LESS  WILL GET YOUR BODY MASS INDEX(BMI) UNDER 30.  FOLLOW A HIGH FIBER/LOW FAT DIET. AVOID ITEMS THAT CAUSE BLOATING.   FOLLOW UP IN 6 MOS.   Next colonoscopy in 3 years, NOT 5-10 YEARS.

## 2018-11-13 NOTE — Telephone Encounter (Signed)
Reminder in epic °

## 2018-11-13 NOTE — Telephone Encounter (Signed)
PT is aware.

## 2018-11-30 DIAGNOSIS — H401121 Primary open-angle glaucoma, left eye, mild stage: Secondary | ICD-10-CM | POA: Diagnosis not present

## 2018-11-30 DIAGNOSIS — H401113 Primary open-angle glaucoma, right eye, severe stage: Secondary | ICD-10-CM | POA: Diagnosis not present

## 2018-12-28 DIAGNOSIS — H401113 Primary open-angle glaucoma, right eye, severe stage: Secondary | ICD-10-CM | POA: Diagnosis not present

## 2018-12-28 DIAGNOSIS — Z79899 Other long term (current) drug therapy: Secondary | ICD-10-CM | POA: Diagnosis not present

## 2018-12-28 DIAGNOSIS — H401121 Primary open-angle glaucoma, left eye, mild stage: Secondary | ICD-10-CM | POA: Diagnosis not present

## 2019-01-23 ENCOUNTER — Encounter: Payer: Self-pay | Admitting: Nurse Practitioner

## 2019-01-23 ENCOUNTER — Encounter: Payer: Self-pay | Admitting: Gastroenterology

## 2019-01-23 ENCOUNTER — Ambulatory Visit (INDEPENDENT_AMBULATORY_CARE_PROVIDER_SITE_OTHER): Payer: BLUE CROSS/BLUE SHIELD | Admitting: Nurse Practitioner

## 2019-01-23 ENCOUNTER — Other Ambulatory Visit: Payer: Self-pay

## 2019-01-23 DIAGNOSIS — D649 Anemia, unspecified: Secondary | ICD-10-CM

## 2019-01-23 DIAGNOSIS — R195 Other fecal abnormalities: Secondary | ICD-10-CM

## 2019-01-23 DIAGNOSIS — K2941 Chronic atrophic gastritis with bleeding: Secondary | ICD-10-CM

## 2019-01-23 DIAGNOSIS — K2940 Chronic atrophic gastritis without bleeding: Secondary | ICD-10-CM | POA: Insufficient documentation

## 2019-01-23 NOTE — Assessment & Plan Note (Signed)
Anemia was very mild around 12 which was normocytic and normochromic.  No identified etiology of anemia on colonoscopy and upper endoscopy.  Likely due to chronic kidney disease, stage II/mild.  Recommend he continue to follow with his primary care provider for hypertension management to prevent worsening kidney disease.  I will check a CBC today for the status of anemia.  Follow-up in 6 months.

## 2019-01-23 NOTE — Progress Notes (Signed)
Referring Provider: Denny Levy, [place] Primary Care Physician:  Denny Levy, [place] Primary GI:  Dr. Oneida Alar  NOTE: Service was provided via telemedicine and was requested by the patient due to COVID-19 pandemic.  Method of visit: FaceTime  Patient Location: Work  Provider Location: Office  Reason for Phone Visit: Follow-up on anemia  The patient was consented to phone follow-up via telephone encounter including billing of the encounter (yes/no): Yes  Persons present on the phone encounter, with roles: None  Total time (minutes) spent on medical discussion: 17 minutes  Chief Complaint  Patient presents with  . Anemia    HPI:   Austin Coffey is a 53 y.o. male who presents for virtual visit regarding: Follow-up related to anemia.  The patient was last seen in our office 09/13/2018 for anemia and heme positive stool.  Creatinine mildly elevated at 1.51.  CBC with mild anemia and hemoglobin of 12.5 which is normocytic and normochromic.  Hemosure testing was positive.  At his visit he was doing well, no obvious hematochezia or melena.  Essentially no GI symptoms.  Due to anemia recommended colonoscopy and EGD, update labs, follow-up in 4 months.  Labs including iron and ferritin were ordered but not completed.  Colonoscopy completed 11/06/2018 which found two sessile polyps in the hepatic flexure and ascending colon from 3 to 6 mm in size as well as a single 3 mm polyp in the rectum which was sessile, internal hemorrhoids.  Surgical pathology found the polyps to be tubular adenoma.  Recommended repeat colonoscopy in 3 years (2023).  EGD completed the same day found normal esophagus, mild inflammation with congestion and erythema in the entire stomach status post biopsy, duodenum normal status post biopsy.  Overall felt no identified source for anemia and likely due to chronic kidney disease with heme positive stool likely from gastritis, polyps, hemorrhoids.  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 pylori) and the duodenal biopsies to be benign duodenal mucosa.  Recommended drink adequate water, continue weight loss efforts, high-fiber/low-fat diet.  Recommended 91-month follow-up.  Today he states he's doing well overall. Denies abdominal pain, N/V, hematochezia, melena, fever, chills, unintentional weight loss. Has lost 40 lbs in the past 3-4 months intentional weight loss with diet and exercise. Has intermittent/rare GERD symptoms about every couple months. Denies URI and flu-like symptoms. Denies chest pain, dyspnea, dizziness, lightheadedness, syncope, near syncope. Denies any other upper or lower GI symptoms.  Denies NSAIDs and ASA powders.  Past Medical History:  Diagnosis Date  . Anemia   . Chronic kidney disease, stage 2 (mild)   . Glaucoma   . Hypertension     Past Surgical History:  Procedure Laterality Date  . COLONOSCOPY N/A 11/06/2018   Procedure: COLONOSCOPY;  Surgeon: Danie Binder, MD;  Location: [facility];  Service: Endoscopy;  Laterality: N/A;  2:15pm  . ESOPHAGOGASTRODUODENOSCOPY N/A 11/06/2018   Procedure: ESOPHAGOGASTRODUODENOSCOPY (EGD);  Surgeon: Danie Binder, MD;  Location: [facility];  Service: Endoscopy;  Laterality: N/A;  . GLAUCOMA SURGERY    . POLYPECTOMY  11/06/2018   Procedure: POLYPECTOMY;  Surgeon: Danie Binder, MD;  Location: [facility];  Service: Endoscopy;;    Current Outpatient Medications  Medication Sig Dispense Refill  . amLODipine (NORVASC) 5 MG tablet Take 5 mg by mouth daily.  0  . guaiFENesin (MUCINEX) 600 MG 12 hr tablet Take 1,200 mg by mouth 2 (  two) times daily as needed (sinus pressure).    Marland Kitchen latanoprost (XALATAN) 0.005 % ophthalmic solution Place 1 drop into the left eye at bedtime.  11  . timolol (TIMOPTIC) 0.5 % ophthalmic solution Place 1 drop into both eyes 2 (two) times daily. Does left eye twice daily.  Does right  eye only one time daily     No current facility-administered medications for this visit.     Allergies as of 01/23/2019  . (No Known Allergies)    Family History  Problem Relation Age of Onset  . Breast cancer Mother   . Colon polyps Father        AGE 8-75  . Colon cancer Neg Hx   . Gastric cancer Neg Hx   . Esophageal cancer Neg Hx     Social History   Socioeconomic History  . Marital status: Married    Spouse name: Not on file  . Number of children: Not on file  . Years of education: Not on file  . Highest education level: Not on file  Occupational History  . Not on file  Social Needs  . Financial resource strain: Not on file  . Food insecurity:    Worry: Not on file    Inability: Not on file  . Transportation needs:    Medical: Not on file    Non-medical: Not on file  Tobacco Use  . Smoking status: Former Research scientist (life sciences)  . Smokeless tobacco: Never Used  Substance and Sexual Activity  . Alcohol use: Not Currently  . Drug use: Never  . Sexual activity: Not on file  Lifestyle  . Physical activity:    Days per week: Not on file    Minutes per session: Not on file  . Stress: Not on file  Relationships  . Social connections:    Talks on phone: Not on file    Gets together: Not on file    Attends religious service: Not on file    Active member of club or organization: Not on file    Attends meetings of clubs or organizations: Not on file    Relationship status: Not on file  Other Topics Concern  . 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    Review of Systems: Complete ROS negative except as per HPI.  Physical Exam: Note: limited exam due to virtual visit General:   Alert and oriented. Pleasant and cooperative. Well-nourished and well-developed.  Head:  Normocephalic and atraumatic. Eyes:  Without icterus, sclera clear and conjunctiva pink.  Ears:  Normal auditory acuity. Skin:  Intact  without facial significant lesions or rashes. Neurologic:  Alert and oriented x4;  grossly normal neurologically. Psych:  Alert and cooperative. Normal mood and affect. Heme/Lymph/Immune: No excessive bruising noted.

## 2019-01-23 NOTE — Assessment & Plan Note (Signed)
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  Recommended avoid trigger substances especially salt and foods that cause bowel regurgitation.  No indication for PPI at this time.  He has GERD symptoms about every several months, states he cannot remember the last time it happened.  Return for follow-up in 6 months, continue to monitor symptoms.

## 2019-01-23 NOTE — Progress Notes (Signed)
CC'ED TO PCP 

## 2019-01-23 NOTE — Assessment & Plan Note (Signed)
Heme positive stool deemed likely due to colon polyps and chronic atrophic gastritis.  Anemia, as discussed below, likely due to chronic kidney disease.  At this point the patient will monitor for any obvious hematochezia or melena and notify us.  I will recheck his CBC.  Follow-up in 6 months.

## 2019-01-23 NOTE — Patient Instructions (Signed)
Your health issues we discussed today were:   Anemia and blood in your stool: 1. Your anemia is probably due to mild kidney disease. 2. Continue to follow with your primary care provider for blood pressure management 3. Have your blood cell count checked when you are able to (have put the order into the lab today)  Gastritis (irritation of the lining your stomach): 1. This is likely due to to irritants such as bowel regurgitation and trigger foods, especially salt 2. Avoid triggers that cause rare reflux for you 3. Avoid high salt diet 4. Let us know if you see any blood or black stools or if you start having more bothersome symptoms  Overall I recommend:  1. Return for follow-up in 6 months 2. Call us if you have any questions or concerns 3. Continue your other medications   Because of recent events of COVID-19 ("Coronavirus"), follow CDC recommendations:  1. Wash your hand frequently 2. Avoid touching your face 3. Stay away from people who are sick 4. If you have symptoms such as fever, cough, shortness of breath then call your healthcare provider for further guidance 5. If you are sick, STAY AT HOME unless otherwise directed by your healthcare provider. 6. Follow directions from state and national officials regarding staying safe   At [facility] we value your feedback. You may receive a survey about your visit today. Please share your experience as we strive to create trusting relationships with our patients to provide genuine, compassionate, quality care.  We appreciate your understanding and patience as we review any laboratory studies, imaging, and other diagnostic tests that are ordered as we care for you. Our office policy is 5 business days for review of these results, and any emergent or urgent results are addressed in a timely manner for your best interest. If you do not hear from our office in 1 week, please contact us.   We also encourage the use of  MyChart, which contains your medical information for your review as well. If you are not enrolled in this feature, an access code is on this after visit summary for your convenience. Thank you for allowing [place] to be involved in your care.  It was great to see you today!  I hope you have a great day!!

## 2019-04-12 DIAGNOSIS — H401121 Primary open-angle glaucoma, left eye, mild stage: Secondary | ICD-10-CM | POA: Diagnosis not present

## 2019-04-12 DIAGNOSIS — H401113 Primary open-angle glaucoma, right eye, severe stage: Secondary | ICD-10-CM | POA: Diagnosis not present

## 2019-04-12 DIAGNOSIS — Z79899 Other long term (current) drug therapy: Secondary | ICD-10-CM | POA: Diagnosis not present

## 2019-04-18 DIAGNOSIS — I10 Essential (primary) hypertension: Secondary | ICD-10-CM | POA: Diagnosis not present

## 2019-04-18 DIAGNOSIS — Z6841 Body Mass Index (BMI) 40.0 and over, adult: Secondary | ICD-10-CM | POA: Diagnosis not present

## 2019-06-05 DIAGNOSIS — N182 Chronic kidney disease, stage 2 (mild): Secondary | ICD-10-CM | POA: Diagnosis not present

## 2019-06-22 DIAGNOSIS — H401113 Primary open-angle glaucoma, right eye, severe stage: Secondary | ICD-10-CM | POA: Diagnosis not present

## 2019-06-22 DIAGNOSIS — H401121 Primary open-angle glaucoma, left eye, mild stage: Secondary | ICD-10-CM | POA: Diagnosis not present

## 2019-07-23 DIAGNOSIS — N182 Chronic kidney disease, stage 2 (mild): Secondary | ICD-10-CM | POA: Diagnosis not present

## 2019-07-23 DIAGNOSIS — R351 Nocturia: Secondary | ICD-10-CM | POA: Diagnosis not present

## 2019-07-23 DIAGNOSIS — D649 Anemia, unspecified: Secondary | ICD-10-CM | POA: Diagnosis not present

## 2019-07-23 DIAGNOSIS — Z6841 Body Mass Index (BMI) 40.0 and over, adult: Secondary | ICD-10-CM | POA: Diagnosis not present

## 2019-07-23 DIAGNOSIS — I10 Essential (primary) hypertension: Secondary | ICD-10-CM | POA: Diagnosis not present

## 2019-07-23 DIAGNOSIS — R739 Hyperglycemia, unspecified: Secondary | ICD-10-CM | POA: Diagnosis not present

## 2019-07-29 ENCOUNTER — Encounter: Payer: Self-pay | Admitting: Gastroenterology

## 2019-07-29 NOTE — Progress Notes (Deleted)
Referring Provider: Denny Levy, PA Primary Care Physician:  Denny Levy, [place] Primary GI Physician: Dr. Rayne Du chief complaint on file.   HPI:   Austin Coffey is a 53 y.o. male presenting today for follow-up on anemia.  He has history of mild anemia in December 2019 with hemoglobin 12.5 with normocytic normochromic indices.  Hematuria testing was positive at that time.  He was without any obvious GI bleeding. Colonoscopy completed 11/06/2018 with tubular adenomas and internal hemorrhoids recommended repeat in 2023.  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 No H. Pylori. Overall it was felt his anemia was likely secondary to CKD and heme positive stool likely related to gastritis, polyps, or hemorrhoids.  Patient was last seen in our office on 01/23/2019 for follow-up of the same.  He was doing well at that time with intentional 40 pound weight loss in the last 3-4 months.  He was advised to continue to follow with his PCP. Plans to repeat CBC for status of anemia; however, labs were not completed.   Today he states  Past Medical History:  Diagnosis Date  . Anemia   . Chronic kidney disease, stage 2 (mild)   . Glaucoma   . Hypertension     Past Surgical History:  Procedure Laterality Date  . COLONOSCOPY N/A 11/06/2018   Procedure: COLONOSCOPY;  Surgeon: Danie Binder, MD;  [facility]: [facility];  Service: Endoscopy;  Laterality: N/A;  2:15pm  . ESOPHAGOGASTRODUODENOSCOPY N/A 11/06/2018   Procedure: ESOPHAGOGASTRODUODENOSCOPY (EGD);  Surgeon: Danie Binder, MD;  Location: [facility];  Service: Endoscopy;  Laterality: N/A;  . GLAUCOMA SURGERY    . POLYPECTOMY  11/06/2018   Procedure: POLYPECTOMY;  Surgeon: Danie Binder, MD;  Location: [facility];  Service: Endoscopy;;    Current Outpatient Medications  Medication Sig Dispense Refill  . amLODipine (NORVASC) 5 MG tablet Take 5 mg by mouth daily.   0  . guaiFENesin (MUCINEX) 600 MG 12 hr tablet Take 1,200 mg by mouth 2 (two) times daily as needed (sinus pressure).    Marland Kitchen latanoprost (XALATAN) 0.005 % ophthalmic solution Place 1 drop into the left eye at bedtime.  11   No current facility-administered medications for this visit.     Allergies as of 07/30/2019  . (No Known Allergies)    Family History  Problem Relation Age of Onset  . Breast cancer Mother   . Colon polyps Father        AGE 85-75  . Colon cancer Neg Hx   . Gastric cancer Neg Hx   . Esophageal cancer Neg Hx     Social History   Socioeconomic History  . Marital status: Married    Spouse name: Not on file  . Number of children: Not on file  . Years of education: Not on file  . Highest education level: Not on file  Occupational History  . Not on file  Social Needs  . Financial resource strain: Not on file  . Food insecurity    Worry: Not on file    Inability: Not on file  . Transportation needs    Medical: Not on file    Non-medical: Not on file  Tobacco Use  . Smoking status: Former Research scientist (life sciences)  . Smokeless tobacco: Never Used  Substance and Sexual Activity  . Alcohol use: Not Currently  . Drug use: Never  . Sexual activity: Not on file  Lifestyle  .  Physical activity    Days per week: Not on file    Minutes per session: Not on file  . Stress: Not on file  Relationships  . Social Herbalist on phone: Not on file    Gets together: Not on file    Attends religious service: Not on file    Active member of club or organization: Not on file    Attends meetings of clubs or organizations: Not on file    Relationship status: Not on file  Other Topics Concern  . 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    Review of Systems: Gen: Denies fever, chills, anorexia. Denies fatigue, weakness, weight loss.  CV: Denies chest pain, palpitations, syncope, peripheral edema, and  claudication. Resp: Denies dyspnea at rest, cough, wheezing, coughing up blood, and pleurisy. GI: Denies vomiting blood, jaundice, and fecal incontinence.   Denies dysphagia or odynophagia. Derm: Denies rash, itching, dry skin Psych: Denies depression, anxiety, memory loss, confusion. No homicidal or suicidal ideation.  Heme: Denies bruising, bleeding, and enlarged lymph nodes.  Physical Exam: There were no vitals taken for this visit. General:   Alert and oriented. No distress noted. Pleasant and cooperative.  Head:  Normocephalic and atraumatic. Eyes:  Conjuctiva clear without scleral icterus. Mouth:  Oral mucosa pink and moist. Good dentition. No lesions. Heart:  S1, S2 present without murmurs appreciated. Lungs:  Clear to auscultation bilaterally. No wheezes, rales, or rhonchi. No distress.  Abdomen:  +BS, soft, non-tender and non-distended. No rebound or guarding. No HSM or masses noted. Msk:  Symmetrical without gross deformities. Normal posture. Extremities:  Without edema. Neurologic:  Alert and  oriented x4 Psych:  Alert and cooperative. Normal mood and affect.

## 2019-07-30 ENCOUNTER — Ambulatory Visit: Payer: BLUE CROSS/BLUE SHIELD | Admitting: Nurse Practitioner

## 2019-07-30 ENCOUNTER — Telehealth: Payer: Self-pay | Admitting: Gastroenterology

## 2019-07-30 ENCOUNTER — Encounter: Payer: Self-pay | Admitting: Gastroenterology

## 2019-07-30 ENCOUNTER — Ambulatory Visit: Payer: BLUE CROSS/BLUE SHIELD | Admitting: Gastroenterology

## 2019-07-30 NOTE — Telephone Encounter (Signed)
Patient was a no show and letter sent  °

## 2021-11-18 ENCOUNTER — Encounter: Payer: Self-pay | Admitting: *Deleted

## 2022-08-06 ENCOUNTER — Encounter: Payer: Self-pay | Admitting: *Deleted

## 2023-01-10 ENCOUNTER — Encounter: Payer: Self-pay | Admitting: *Deleted

## 2023-07-07 ENCOUNTER — Encounter (INDEPENDENT_AMBULATORY_CARE_PROVIDER_SITE_OTHER): Payer: Self-pay | Admitting: *Deleted

## 2023-07-14 ENCOUNTER — Emergency Department (HOSPITAL_COMMUNITY): Payer: BC Managed Care – PPO

## 2023-07-14 ENCOUNTER — Other Ambulatory Visit: Payer: Self-pay

## 2023-07-14 ENCOUNTER — Emergency Department (HOSPITAL_COMMUNITY)
Admission: EM | Admit: 2023-07-14 | Discharge: 2023-07-15 | Discharge status: Against Medical Advice | Payer: BC Managed Care – PPO

## 2023-07-14 ENCOUNTER — Encounter (HOSPITAL_COMMUNITY): Payer: Self-pay | Admitting: *Deleted

## 2023-07-14 DIAGNOSIS — Z5329 Procedure and treatment not carried out because of patient's decision for other reasons: Secondary | ICD-10-CM | POA: Insufficient documentation

## 2023-07-14 DIAGNOSIS — Z79899 Other long term (current) drug therapy: Secondary | ICD-10-CM | POA: Insufficient documentation

## 2023-07-14 DIAGNOSIS — I10 Essential (primary) hypertension: Secondary | ICD-10-CM | POA: Diagnosis not present

## 2023-07-14 DIAGNOSIS — C9502 Acute leukemia of unspecified cell type, in relapse: Secondary | ICD-10-CM | POA: Diagnosis not present

## 2023-07-14 DIAGNOSIS — K6289 Other specified diseases of anus and rectum: Secondary | ICD-10-CM | POA: Diagnosis present

## 2023-07-14 LAB — CBC WITH DIFFERENTIAL/PLATELET
Abs Immature Granulocytes: 0 10*3/uL (ref 0.00–0.07)
Band Neutrophils: 0 %
Basophils Absolute: 0 10*3/uL (ref 0.0–0.1)
Basophils Relative: 0 %
Blasts: 93 %
Eosinophils Absolute: 0 10*3/uL (ref 0.0–0.5)
Eosinophils Relative: 0 %
HCT: 33.1 % — ABNORMAL LOW (ref 39.0–52.0)
Hemoglobin: 10.5 g/dL — ABNORMAL LOW (ref 13.0–17.0)
Lymphocytes Relative: 7 %
Lymphs Abs: 5 10*3/uL — ABNORMAL HIGH (ref 0.7–4.0)
MCH: 30.3 pg (ref 26.0–34.0)
MCHC: 31.7 g/dL (ref 30.0–36.0)
MCV: 95.7 fL (ref 80.0–100.0)
Metamyelocytes Relative: 0 %
Monocytes Absolute: 0 10*3/uL — ABNORMAL LOW (ref 0.1–1.0)
Monocytes Relative: 0 %
Myelocytes: 0 %
Neutro Abs: 0 10*3/uL — CL (ref 1.7–7.7)
Neutrophils Relative %: 0 %
Other: 0 %
Platelets: 59 10*3/uL — ABNORMAL LOW (ref 150–400)
Promyelocytes Relative: 0 %
RBC: 3.46 MIL/uL — ABNORMAL LOW (ref 4.22–5.81)
RDW: 14.8 % (ref 11.5–15.5)
WBC: 71 10*3/uL (ref 4.0–10.5)
nRBC: 0 /100{WBCs}
nRBC: 0.2 % (ref 0.0–0.2)

## 2023-07-14 LAB — COMPREHENSIVE METABOLIC PANEL
ALT: 19 U/L (ref 0–44)
AST: 22 U/L (ref 15–41)
Albumin: 4 g/dL (ref 3.5–5.0)
Alkaline Phosphatase: 61 U/L (ref 38–126)
Anion gap: 13 (ref 5–15)
BUN: 14 mg/dL (ref 6–20)
CO2: 22 mmol/L (ref 22–32)
Calcium: 8.9 mg/dL (ref 8.9–10.3)
Chloride: 105 mmol/L (ref 98–111)
Creatinine, Ser: 1.46 mg/dL — ABNORMAL HIGH (ref 0.61–1.24)
GFR, Estimated: 56 mL/min — ABNORMAL LOW (ref >60)
Glucose, Bld: 101 mg/dL — ABNORMAL HIGH (ref 70–99)
Potassium: 3.5 mmol/L (ref 3.5–5.1)
Sodium: 140 mmol/L (ref 135–145)
Total Bilirubin: 1.1 mg/dL (ref 0.3–1.2)
Total Protein: 8.2 g/dL — ABNORMAL HIGH (ref 6.5–8.1)

## 2023-07-14 LAB — LACTIC ACID, PLASMA
Lactic Acid, Venous: 1.8 mmol/L (ref 0.5–1.9)
Lactic Acid, Venous: 1.1 mmol/L (ref 0.5–1.9)

## 2023-07-14 LAB — URINALYSIS, ROUTINE W REFLEX MICROSCOPIC
Bacteria, UA: NONE SEEN
Bilirubin Urine: NEGATIVE
Glucose, UA: NEGATIVE mg/dL
Hgb urine dipstick: NEGATIVE
Ketones, ur: 20 mg/dL — AB
Leukocytes,Ua: NEGATIVE
Nitrite: NEGATIVE
Protein, ur: 30 mg/dL — AB
Specific Gravity, Urine: 1.02 (ref 1.005–1.030)
pH: 5 (ref 5.0–8.0)

## 2023-07-14 LAB — PATHOLOGIST SMEAR REVIEW

## 2023-07-14 MED ORDER — IOHEXOL 300 MG/ML  SOLN
100.0000 mL | Freq: Once | INTRAMUSCULAR | Status: AC | PRN
  Administered 2023-07-14: 100 mL via INTRAVENOUS

## 2023-07-14 MED ORDER — MORPHINE SULFATE (PF) 4 MG/ML IV SOLN
4.0000 mg | Freq: Once | INTRAVENOUS | Status: AC
  Administered 2023-07-14: 4 mg via INTRAVENOUS
  Filled 2023-07-14: qty 1

## 2023-07-14 MED ORDER — SODIUM CHLORIDE 0.9 % IV BOLUS
1000.0000 mL | Freq: Once | INTRAVENOUS | Status: AC
  Administered 2023-07-14: 1000 mL via INTRAVENOUS

## 2023-07-14 MED ORDER — PIPERACILLIN-TAZOBACTAM 3.375 G IVPB
3.3750 g | Freq: Three times a day (TID) | INTRAVENOUS | Status: DC
  Administered 2023-07-14: 3.375 g via INTRAVENOUS
  Filled 2023-07-14: qty 50

## 2023-07-14 MED ORDER — ACETAMINOPHEN 325 MG PO TABS
650.0000 mg | ORAL_TABLET | Freq: Once | ORAL | Status: AC
  Administered 2023-07-14: 650 mg via ORAL
  Filled 2023-07-14: qty 2

## 2023-07-14 MED ORDER — ONDANSETRON HCL 4 MG/2ML IJ SOLN
4.0000 mg | Freq: Once | INTRAMUSCULAR | Status: AC
  Administered 2023-07-14: 4 mg via INTRAVENOUS
  Filled 2023-07-14: qty 2

## 2023-07-14 MED ORDER — VANCOMYCIN HCL 2000 MG/400ML IV SOLN
2000.0000 mg | Freq: Once | INTRAVENOUS | Status: AC
  Administered 2023-07-14: 2000 mg via INTRAVENOUS
  Filled 2023-07-14: qty 400

## 2023-07-14 NOTE — ED Provider Notes (Signed)
[place] [facility] AT [facility] Provider Note   CSN: [id number] Arrival date & time: 07/14/23  1426     History  Chief Complaint  Patient presents with   Rectal Pain    Austin Coffey is a 57 y.o. male.  57 year old male with past medical history of hypertension and glaucoma presenting to the emergency department today with rectal pain.  The patient states that he had a large bowel movement 2 days ago and afterwards he has been having a lot of pain.  He states that before that he not had a bowel movement in quite some time.  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  In addition to this he followed up with his doctor today for low-grade fevers he has been having now for the past week and a half.  The patient states that he has had some lesions on his face, arms, and legs and he was treated empirically for possible MRSA.  He completed a 10-day course of doxycycline today.  He states that he has had low-grade fevers now for the past few weeks.        Home Medications Prior to Admission medications   Medication Sig Start Date End Date Taking? Authorizing Provider  amLODipine (NORVASC) 5 MG tablet Take 7.5 mg by mouth in the morning. 08/29/18  Yes [provider]  brimonidine (ALPHAGAN) 0.2 % ophthalmic solution Place 1 drop into the right eye 2 (two) times daily.   Yes [provider]  dorzolamide-timolol (COSOPT) 2-0.5 % ophthalmic solution Place 1 drop into the right eye 2 (two) times daily.   Yes [provider]  latanoprost (XALATAN) 0.005 % ophthalmic solution Place 1 drop into the right eye at bedtime. 08/31/18  Yes [provider]  MUCINEX SINUS-MAX 5-10-200-325 MG TABS Take 1-2 tablets by mouth every 6 (six) hours as needed (for congestion).   Yes [provider]  TYLENOL 500 MG tablet Take 1,000 mg by mouth every 6 (six) hours as needed for mild pain or headache.   Yes  [provider]      Allergies    Patient has no known allergies.    Review of Systems   Review of Systems  Constitutional:  Positive for fatigue.  Gastrointestinal:  Positive for rectal pain. Negative for anal bleeding.  All other systems reviewed and are negative.   Physical Exam Updated Vital Signs BP 126/66   Pulse 86   Temp 99.8 F (37.7 C) (Oral)   Resp 16   Ht 6\' 2"  (1.88 m)   Wt (!) 152.4 kg   SpO2 93%   BMI 43.14 kg/m  Physical Exam Vitals and nursing note reviewed.   Gen: NAD Eyes: PERRL, EOMI HEENT: no oropharyngeal swelling Neck: trachea midline Resp: clear to auscultation bilaterally Card: Tachycardic, no murmurs, rubs, or gallops Abd: nontender, nondistended Rectal: Chaperoned by male nursing staff shows appears to be a fissure at 6:00.  The patient does have some tenderness and induration internally but no external fluctuance or induration noted Extremities: no calf tenderness, no edema Vascular: 2+ radial pulses bilaterally, 2+ DP pulses bilaterally Skin: no rashes Psyc: acting appropriately   ED Results / Procedures / Treatments   Labs (all labs ordered are listed, but only abnormal results are displayed) Labs Reviewed  COMPREHENSIVE METABOLIC PANEL - Abnormal; Notable for the following components:      Result Value   Glucose, Bld 101 (*)  Creatinine, Ser 1.46 (*)    Total Protein 8.2 (*)    GFR, Estimated 56 (*)    All other components within normal limits  CBC WITH DIFFERENTIAL/PLATELET - Abnormal; Notable for the following components:   WBC 71.0 (*)    RBC 3.46 (*)    Hemoglobin 10.5 (*)    HCT 33.1 (*)    Platelets 59 (*)    Neutro Abs 0.0 (*)    Lymphs Abs 5.0 (*)    Monocytes Absolute 0.0 (*)    All other components within normal limits  URINALYSIS, ROUTINE W REFLEX MICROSCOPIC - Abnormal; Notable for the following components:   Ketones, ur 20 (*)    Protein, ur 30 (*)    All other components within normal limits   CULTURE, BLOOD (ROUTINE X 2)  CULTURE, BLOOD (ROUTINE X 2)  LACTIC ACID, PLASMA  LACTIC ACID, PLASMA  PATHOLOGIST SMEAR REVIEW  FLOW CYTOMETRY    EKG None  Radiology CT ABDOMEN PELVIS W CONTRAST  Result Date: 07/14/2023 CLINICAL DATA:  Left lower quadrant abdominal pain and rectal pain EXAM: CT ABDOMEN AND PELVIS WITH CONTRAST TECHNIQUE: Multidetector CT imaging of the abdomen and pelvis was performed using the standard protocol following bolus administration of intravenous contrast. RADIATION DOSE REDUCTION: This exam was performed according to the departmental dose-optimization program which includes automated exposure control, adjustment of the mA and/or kV according to patient size and/or use of iterative reconstruction technique. CONTRAST:  OMNIPAQUE IOHEXOL 300 MG/ML  SOLN COMPARISON:  None Available. FINDINGS: Lower chest: No acute abnormality. Hepatobiliary: Unremarkable liver. Normal gallbladder. No biliary dilation. Pancreas: Unremarkable. Spleen: Unremarkable. Adrenals/Urinary Tract: Normal adrenal glands. No urinary calculi or hydronephrosis. Bladder is unremarkable. Stomach/Bowel: Rectal wall thickening with adjacent hazy fat stranding. Normal caliber large and small bowel. The appendix is normal.Stomach is within normal limits. Vascular/Lymphatic: No significant vascular findings are present. No enlarged abdominal or pelvic lymph nodes. Reproductive: Unremarkable. Other: No free intraperitoneal air. No organized fluid collection or abscess. Musculoskeletal: No acute fracture. IMPRESSION: Rectal wall thickening with adjacent hazy fat stranding, compatible with proctitis. No organized fluid collection or abscess. Consider colonoscopy when clinically appropriate to rule out underlying mass. Electronically Signed   By: Minerva Fester M.D.   On: 07/14/2023 20:03   DG Chest Portable 1 View  Result Date: 07/14/2023 CLINICAL DATA:  Orthopnea. EXAM: PORTABLE CHEST 1 VIEW COMPARISON:   06/29/2006 FINDINGS: The heart size and mediastinal contours are within normal limits. Both lungs are clear. The visualized skeletal structures are unremarkable. IMPRESSION: No active disease. Electronically Signed   By: Danae Orleans M.D.   On: 07/14/2023 18:10    Procedures Procedures    Medications Ordered in ED Medications  piperacillin-tazobactam (ZOSYN) IVPB 3.375 g (0 g Intravenous Stopped 07/14/23 2300)  vancomycin (VANCOREADY) IVPB 2000 mg/400 mL (2,000 mg Intravenous New Bag/Given 07/14/23 2302)  morphine (PF) 4 MG/ML injection 4 mg (4 mg Intravenous Given 07/14/23 1848)  ondansetron (ZOFRAN) injection 4 mg (4 mg Intravenous Given 07/14/23 1848)  iohexol (OMNIPAQUE) 300 MG/ML solution 100 mL (100 mLs Intravenous Contrast Given 07/14/23 1850)  acetaminophen (TYLENOL) tablet 650 mg (650 mg Oral Given 07/14/23 2020)  sodium chloride 0.9 % bolus 1,000 mL (1,000 mLs Intravenous New Bag/Given 07/14/23 2341)    ED Course/ Medical Decision Making/ A&P  Medical Decision Making 57 year old male with past medical history of hypertension and anal fissures in the past presenting to the emergency department today with tachycardia and low-grade fevers in addition to what does appear to be an anal fissure here on exam.  He does have some internal induration and tenderness.  I will obtain a CT scan of his abdomen to further evaluate for perirectal abscess.  Will give patient IV fluids.  Given his low-grade fevers blood cultures are added on.  His white count came back significantly elevated.  I more concerned at this point about malignancy rather than sepsis.  Will hold off on antibiotics at this time for this reason.  I will give the patient pain medication here as well as IV fluids and reevaluate.  The patient's differential is concerning for leukemia.  His CT scan did come back with findings consistent with proctitis.  Given the patient's potential immunocompromise status  the patient is given Zosyn.  This was started at 10:04 PM after his CT scan is resulted.  A call was placed to oncology to discuss further management.  I spoke with Dr. Truett Perna shortly after.  He recommended transfer to [facility]  A call was placed to [facility] for transfer.  They recommended fluids and antibiotics for now and to call back with any issues.  I did call the physician access line after not heard back from the transfer center 1 hour after the initial call was placed.  They inform me that the transfer nurse was reviewing the chart and should be contacting me shortly at 11PM.  I did speak with Dr. Garnette Czech who accepts patient for transfer.  He will be transferred to [facility] for further evaluation management.  Critical care time 45 minutes including reassessments, coronation care with accepting facility, coronation care with oncology service here  Amount and/or Complexity of Data Reviewed Radiology: ordered.  Risk OTC drugs. Prescription drug management.           Final Clinical Impression(s) / ED Diagnoses Final diagnoses:  Acute leukemia of unspecified cell type, in relapse [facility])    Rx / DC Orders ED Discharge Orders     None         Durwin Glaze, MD 07/15/23 0003

## 2023-07-14 NOTE — ED Triage Notes (Signed)
Pt was constipated, finally had a stool now has pain and swelling in rectum. Pt has ? MRSA on body where he has sore like areas that are not healing.

## 2023-07-14 NOTE — ED Notes (Signed)
Report received from previous RN. This RN assumes care of the patient.

## 2023-07-14 NOTE — ED Notes (Addendum)
7:54 PM  Patient's temp of 102.7 reported to Beckey Downing, MD.   7:55 PM  Patient reports rectal pain x 3 days. Hx anal fissure and states that he has been eating "a lot of dairy", which worsens his rectal pain. Recently saw PCP and prescribed 10-day course of antibiotics for possible MRSA infection. Patient does endorse completing antibiotic course. Patient denies nausea, vomiting, abdominal pain. LBM earlier today, which patient does state was normal. Denies bleeding in stool. Currently rates pain 5/10 on pain scale, which patient reports is manageable. He denies urinary sx. He is medicated per order. Call light in reach. Bed in lowest position. Pending results. Patient denies any further needs at this time. Hx HTN.    9:17 PM  Dr. Beckey Downing notified of patient's repeat temperature following tylenol administration at 103.0. Awaiting orders   9:20 PM   RN spoke in person with Dr. Rhae Hammock to report patient's elevated temperature. Dr. Rhae Hammock informed of inability to get 2nd set of [facility]. Per Dr. Rhae Hammock, 2nd set of [facility] not needed and Dr. Rhae Hammock will speak with patient and family once CT results post.   9:28 PM  Patient and family updated on wait time for CT. Patient and family informed that Dr. Rhae Hammock will come in and talk with them when CT results post. Patient and family voice understanding. They deny any needs at this time. Call light in reach. Bed in lowest position. Plan of care ongoing.   10:29 PM  Per Dr. Rhae Hammock, start antibiotics without 2nd set of [facility] due to patient difficulty to stick for cultures.   10:38 PM   Ice packs placed on patient's neck and groin for fever reducer.   1:32 AM  Patient medicated per order.   2:12 AM  Patient left AMA. AMA paperwork signed by patient. Patient wife to transport patient home.

## 2023-07-14 NOTE — ED Provider Triage Note (Signed)
Emergency Medicine Provider Triage Evaluation Note  ASTON LIESKE , a 57 y.o. male  was evaluated in triage.  Pt complains of anal pain. Patient reports history of constipation and now believes he has an anal fissure. Was given cream in the past that helped.  Was treated for possible MRSA, finished Doxycycline yesterday. Still has temperature of 100F. Reports orthopnea.  Review of Systems  Positive: Fever, rectal pain Negative: Dysuria, abdominal pain  Physical Exam  BP (!) 155/74 (BP Location: Left Arm)   Pulse (!) 101   Temp 100 F (37.8 C) (Oral)   Resp 18   Ht 6\' 2"  (1.88 m)   Wt (!) 152.4 kg   SpO2 100%   BMI 43.14 kg/m  Gen:   Awake, no distress   Resp:  Normal effort  MSK:   Moves extremities without difficulty  Other:    Medical Decision Making  Medically screening exam initiated at 3:30 PM.  Appropriate orders placed.  Sinclair Ship was informed that the remainder of the evaluation will be completed by another provider, this initial triage assessment does not replace that evaluation, and the importance of remaining in the ED until their evaluation is complete.   Maxwell Marion, PA-C 07/14/23 1539

## 2023-07-15 MED ORDER — ACETAMINOPHEN 500 MG PO TABS
1000.0000 mg | ORAL_TABLET | Freq: Once | ORAL | Status: AC
  Administered 2023-07-15: 1000 mg via ORAL
  Filled 2023-07-15: qty 2

## 2023-07-15 MED ORDER — MORPHINE SULFATE (PF) 4 MG/ML IV SOLN
4.0000 mg | Freq: Once | INTRAVENOUS | Status: AC
  Administered 2023-07-15: 4 mg via INTRAVENOUS
  Filled 2023-07-15: qty 1

## 2023-07-15 MED ORDER — VANCOMYCIN HCL 10 G IV SOLR: 2250.0000 mg | INTRAVENOUS | Status: DC

## 2023-07-15 NOTE — Progress Notes (Signed)
Pharmacy Antibiotic Note  Austin Coffey is a 57 y.o. male admitted on 07/14/2023 with rectal pain. Pt with hx of anal fissure, hypertension and glaucoma. Pharmacy has been consulted to dose vanc and zosyn for sepsis/intra-abdominal infection.  Plan: Vancomycin 2gm x 1 then 2250mg  q24h (AUC 506.7, Scr 1.46) Zosyn 3.375g IV Q8H infused over 4hrs. Follow renal function, cultures and clinical course  Height: 6\' 2"  (188 cm) Weight: (!) 152.4 kg (336 lb) IBW/kg (Calculated) : 82.2  Temp (24hrs), Avg:101.3 F (38.5 C), Min:99.8 F (37.7 C), Max:103 F (39.4 C)  Recent Labs  Lab 07/14/23 1554 07/14/23 1819  WBC 71.0*  --   CREATININE 1.46*  --   LATICACIDVEN 1.8 1.1    Estimated Creatinine Clearance: 87.1 mL/min (A) (by C-G formula based on SCr of 1.46 mg/dL (H)).    No Known Allergies  Antimicrobials this admission: 10/3 ZEI >> 10/3 vanc >>  Dose adjustments this admission:   Microbiology results: 10/3 BCx:   Thank you for allowing pharmacy to be a part of this patient's care.  Arley Phenix RPh 07/15/2023, 12:53 AM

## 2023-07-15 NOTE — ED Provider Notes (Signed)
Patient was seen by different provider earlier this evening.  He was awaiting bed at [facility].  Was asked by nursing to speak with the patient as he and his wife were stating that they did not want to go by ambulance to another hospital.  On my evaluation patient is awake, alert, oriented.  Wife is at the bedside.  He appears to have capacity.  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  Also has proctitis and an acute infection with fevers.  For this reason I do not feel it is safe for her to transport him by private vehicle to [facility].  Wife states that she feels that she can safely take him.  Discussed with them that if she chose to take him by private vehicle, she would need to sign out AGAINST MEDICAL ADVICE because I do not feel that this is a safe route of transport given his acute illness.  Patient and his wife state understanding.  We discussed the risk of deterioration and death.  Wife initially stated that she would prefer him to go to [facility].  I have offered to make a phone call to see if we can transfer [facility]; however, she still states that she would want to take him by private vehicle.  After collective discussion and discussion regarding risk of AMA including deterioration, worsening of condition, death, patient and his wife both state understanding and are able to verbalize this back to me.  Wife states that she would like to take him by private vehicle to [facility].  Physical Exam  BP (!) 143/56   Pulse 99   Temp 100.1 F (37.8 C) (Oral)   Resp (!) 21   Ht 1.88 m (6\' 2" )   Wt (!) 152.4 kg   SpO2 96%   BMI 43.14 kg/m   Physical Exam Awake, alert, no acute distress No respiratory distress Procedures  Procedures  ED Course / MDM    Medical Decision Making Amount and/or Complexity of Data Reviewed Radiology: ordered.  Risk OTC drugs. Prescription drug management.   Problem  List Items Addressed This Visit   None Visit Diagnoses     Acute leukemia of unspecified cell type, in relapse (HCC)    -  Primary   Relevant Medications   morphine (PF) 4 MG/ML injection 4 mg (Completed)   acetaminophen (TYLENOL) tablet 650 mg (Completed)   TYLENOL 500 MG tablet   piperacillin-tazobactam (ZOSYN) IVPB 3.375 g   vancomycin (VANCOREADY) IVPB 2000 mg/400 mL (Completed)   vancomycin (VANCOCIN) 2,250 mg in sodium chloride 0.9 % 500 mL IVPB (Start on 07/15/2023 10:00 PM)   acetaminophen (TYLENOL) tablet 1,000 mg (Completed)   morphine (PF) 4 MG/ML injection 4 mg (Completed)             Seth Higginbotham, Mayer Masker, MD 07/15/23 [phone]

## 2023-07-19 LAB — CULTURE, BLOOD (ROUTINE X 2): Culture: NO GROWTH

## 2023-10-20 NOTE — Progress Notes (Deleted)
 Referring Provider: Atilano Deward ORN, MD  Primary Care Physician:  Job Bolt, [place] Primary Gastroenterologist:  Dr. Cindie (previously Dr. Harvey)  No chief complaint on file.   HPI:   Austin Coffey is a 58 y.o. 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  It was felt that heme positive stool likely secondary to gastritis, polyps, or hemorrhoids.  He is presenting today at the request of Dr. Atilano for anal fissure.  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 No H. Pylori.   Colonoscopy completed 11/06/2018 with 3 tubular adenomas and internal hemorrhoids recommended repeat in 2023.    Past Medical History:  Diagnosis Date   Anemia    Chronic kidney disease, stage 2 (mild)    Glaucoma    Hypertension     Past Surgical History:  Procedure Laterality Date   COLONOSCOPY N/A 11/06/2018   Surgeon: Harvey Margo LITTIE, MD; Two 3-6 mm polyps at hepatic flexure and ascending colon, one millimeter polyp in the rectum, internal hemorrhoids, tortuous left colon.  Pathology with tubular adenomas.  Repeat in 2023.   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 pylori, normal duodenal bulb and second portion of the duodenum s/p benign biopsies   GLAUCOMA SURGERY     POLYPECTOMY  11/06/2018   Procedure: POLYPECTOMY;  Surgeon: Harvey Margo LITTIE, MD;  [facility]: [facility];  Service: Endoscopy;;    Current Outpatient Medications  Medication Sig Dispense Refill    amLODipine (NORVASC) 5 MG tablet Take 7.5 mg by mouth in the morning.  0   brimonidine (ALPHAGAN) 0.2 % ophthalmic solution Place 1 drop into the right eye 2 (two) times daily.     dorzolamide-timolol (COSOPT) 2-0.5 % ophthalmic solution Place 1 drop into the right eye 2 (two) times daily.     latanoprost (XALATAN) 0.005 % ophthalmic solution Place 1 drop into the right eye at bedtime.  11   MUCINEX SINUS-MAX 5-10-200-325 MG TABS Take 1-2 tablets by mouth every 6 (six) hours as needed (for congestion).     TYLENOL  500 MG tablet Take 1,000 mg by mouth every 6 (six) hours as needed for mild pain or headache.     No current facility-administered medications for this visit.    Allergies as of 10/21/2023   (No Known Allergies)    Family History  Problem Relation Age of Onset   Breast cancer Mother    Colon polyps Father        AGE 65-75   Colon cancer Neg Hx    Gastric cancer Neg Hx    Esophageal cancer Neg Hx     Social History   Socioeconomic History   Marital status: Married    Spouse name: Not on file   Number of children: Not on file   Years of education: Not on file   Highest education level: Not on file  Occupational History   Not on file  Tobacco Use   Smoking status: Former   Smokeless tobacco: Never  Substance and Sexual Activity   Alcohol use: Not  Currently   Drug use: Never   Sexual activity: Not on file  Other Topics Concern   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   Social Drivers of Corporate Investment Banker Strain: Low Risk  (07/19/2023)   Received from [facility]   Overall Financial Resource Strain (CARDIA)    Difficulty of Paying Living Expenses: Not very hard  Food Insecurity: No Food Insecurity (07/19/2023)   Received from [facility]   Hunger Vital Sign    Worried About Running Out of Food in the Last Year: Never true    Ran Out of Food in the Last Year: Never true   Transportation Needs: No Transportation Needs (07/19/2023)   Received from [facility] - Transportation    Lack of Transportation (Medical): No    Lack of Transportation (Non-Medical): No  Physical Activity: Not on file  Stress: Not on file  Social Connections: Not on file  Intimate Partner Violence: Not on file    Review of Systems: Gen: Denies any fever, chills, fatigue, weight loss, lack of appetite.  CV: Denies chest pain, heart palpitations, peripheral edema, syncope.  Resp: Denies shortness of breath at rest or with exertion. Denies wheezing or cough.  GI: Denies dysphagia or odynophagia. Denies jaundice, hematemesis, fecal incontinence. GU : Denies urinary burning, urinary frequency, urinary hesitancy MS: Denies joint pain, muscle weakness, cramps, or limitation of movement.  Derm: Denies rash, itching, dry skin Psych: Denies depression, anxiety, memory loss, and confusion Heme: Denies bruising, bleeding, and enlarged lymph nodes.  Physical Exam: There were no vitals taken for this visit. General:   Alert and oriented. Pleasant and cooperative. Well-nourished and well-developed.  Head:  Normocephalic and atraumatic. Eyes:  Without icterus, sclera clear and conjunctiva pink.  Ears:  Normal auditory acuity. Lungs:  Clear to auscultation bilaterally. No wheezes, rales, or rhonchi. No distress.  Heart:  S1, S2 present without murmurs appreciated.  Abdomen:  +BS, soft, non-tender and non-distended. No HSM noted. No guarding or rebound. No masses appreciated.  Rectal:  Deferred  Msk:  Symmetrical without gross deformities. Normal posture. Extremities:  Without edema. Neurologic:  Alert and  oriented x4;  grossly normal neurologically. Skin:  Intact without significant lesions or rashes. Psych:  Alert and cooperative. Normal mood and affect.    Assessment:     Plan:  ***   Josette Centers, PA-C [facility] 10/21/2023

## 2023-10-21 ENCOUNTER — Ambulatory Visit: Payer: BC Managed Care – PPO | Admitting: Gastroenterology

## 2023-11-16 ENCOUNTER — Encounter (INDEPENDENT_AMBULATORY_CARE_PROVIDER_SITE_OTHER): Payer: Self-pay | Admitting: Gastroenterology

## 2023-11-16 ENCOUNTER — Ambulatory Visit (INDEPENDENT_AMBULATORY_CARE_PROVIDER_SITE_OTHER): Payer: Medicaid Other | Admitting: Gastroenterology

## 2023-11-16 VITALS — BP 125/77 | HR 80 | Temp 98.6°F | Ht 74.0 in | Wt 301.8 lb

## 2023-11-16 DIAGNOSIS — K602 Anal fissure, unspecified: Secondary | ICD-10-CM

## 2023-11-16 DIAGNOSIS — Z860101 Personal history of adenomatous and serrated colon polyps: Secondary | ICD-10-CM

## 2023-11-16 DIAGNOSIS — K5900 Constipation, unspecified: Secondary | ICD-10-CM | POA: Diagnosis not present

## 2023-11-16 DIAGNOSIS — Z6838 Body mass index (BMI) 38.0-38.9, adult: Secondary | ICD-10-CM | POA: Insufficient documentation

## 2023-11-16 DIAGNOSIS — K5904 Chronic idiopathic constipation: Secondary | ICD-10-CM | POA: Insufficient documentation

## 2023-11-16 DIAGNOSIS — Z860100 Personal history of colon polyps, unspecified: Secondary | ICD-10-CM | POA: Insufficient documentation

## 2023-11-16 MED ORDER — PSYLLIUM 58.6 % PO PACK: 1.0000 | PACK | Freq: Two times a day (BID) | ORAL | 2 refills | Status: AC

## 2023-11-16 MED ORDER — POLYETHYLENE GLYCOL 3350 17 G PO PACK: 17.0000 g | PACK | Freq: Two times a day (BID) | ORAL | 0 refills | Status: AC

## 2023-11-16 NOTE — Patient Instructions (Signed)
It was very nice to meet you today, as dicussed with will plan for the following :  1) Ensure adequate fluid intake: Aim for 8 glasses of water daily. Follow a high fiber diet: Include foods such as dates, prunes, pears, and kiwi. Take Miralax twice a day for the first week, then reduce to once daily thereafter. Use Metamucil twice a day. Lidocaine and Nifedipine cream Sitz bath 10-15 min few times a day  2) colonoscopy in 3 months

## 2023-11-16 NOTE — Progress Notes (Signed)
 [name] , M.D. Gastroenterology & Hepatology [facility] [address] [place], [place] [address] Primary Care Physician: Job Bolt, [place] [address] [place] [address]  Chief Complaint:  Anal Fissure   History of Present Illness:  Austin Coffey is a 58 y.o. male with history of CKD, HTN, glaucoma, recent acute myeloid leukemia s/p chemo  presented for evaluation of anal fissure .  Patient is accompanied by wife bedside.  Reports he was experiencing rectal pain upon defecation which was exacerbated by fears of constipation.  Patient denies seeing any blood in stool.  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  It was felt that heme positive stool likely secondary to gastritis, polyps, or hemorrhoids.   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 No H. Pylori.   Colonoscopy completed 11/06/2018 with 3 tubular adenomas and internal hemorrhoids recommended repeat in 2023.  FHx: neg for any gastrointestinal/liver disease, no malignancies Social: neg smoking, alcohol or illicit drug use Surgical: no abdominal surgeries  Last labs from yesterday 11/15/2023 hemoglobin 10.3 white count 5.9 platelet 256 Normal liver enzymes  Past Medical History: Past Medical History:  Diagnosis Date   Acute myeloblastic leukemia in remission (HCC)    Anemia    Chronic kidney disease, stage 2 (mild)    Glaucoma    Hypertension     Past Surgical History: Past Surgical History:  Procedure Laterality Date   COLONOSCOPY N/A 11/06/2018   Surgeon: Harvey Margo LITTIE, MD; Two 3-6 mm polyps at hepatic flexure and ascending colon,  one millimeter polyp in the rectum, internal hemorrhoids, tortuous left colon.  Pathology with tubular adenomas.  Repeat in 2023.   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 pylori, normal duodenal bulb and second portion of the duodenum s/p benign biopsies   GLAUCOMA SURGERY     POLYPECTOMY  11/06/2018   Procedure: POLYPECTOMY;  Surgeon: Harvey Margo LITTIE, MD;  [facility]: [facility];  Service: Endoscopy;;    Family History: Family History  Problem Relation Age of Onset   Breast cancer Mother    Colon polyps Father        AGE 40-75   Colon cancer Neg Hx    Gastric cancer Neg Hx    Esophageal cancer Neg Hx     Social History: Social History   Tobacco Use  Smoking Status Former  Smokeless Tobacco Never   Social History   Substance and Sexual Activity  Alcohol Use Not Currently   Social History   Substance and Sexual Activity  Drug Use Never    Allergies: Allergies  Allergen Reactions   Meropenem Other (See Comments)    Concern for drug fever while on meropenem 07/2023, has since tolerated Pip-Tazo    Medications: Current Outpatient Medications  Medication Sig Dispense Refill   amLODipine (NORVASC) 5 MG tablet Take 7.5 mg by mouth in the morning.  0   brimonidine (ALPHAGAN) 0.2 % ophthalmic solution Place 1 drop into the right eye 2 (two) times daily.     dorzolamide-timolol (COSOPT) 2-0.5 % ophthalmic solution Place 1 drop  into the right eye 2 (two) times daily.     latanoprost (XALATAN) 0.005 % ophthalmic solution Place 1 drop into the right eye at bedtime.  11   MUCINEX SINUS-MAX 5-10-200-325 MG TABS Take 1-2 tablets by mouth every 6 (six) hours as needed (for congestion).     TYLENOL  500 MG tablet Take 1,000 mg by mouth every 6 (six) hours as needed for mild pain or headache.     No current  facility-administered medications for this visit.    Review of Systems: GENERAL: negative for malaise, night sweats HEENT: No changes in hearing or vision, no nose bleeds or other nasal problems. NECK: Negative for lumps, goiter, pain and significant neck swelling RESPIRATORY: Negative for cough, wheezing CARDIOVASCULAR: Negative for chest pain, leg swelling, palpitations, orthopnea GI: SEE HPI MUSCULOSKELETAL: Negative for joint pain or swelling, back pain, and muscle pain. SKIN: Negative for lesions, rash HEMATOLOGY Negative for prolonged bleeding, bruising easily, and swollen nodes. ENDOCRINE: Negative for cold or heat intolerance, polyuria, polydipsia and goiter. NEURO: negative for tremor, gait imbalance, syncope and seizures. The remainder of the review of systems is noncontributory.   Physical Exam: BP 125/77   Pulse 80   Temp 98.6 F (37 C)   Ht 6' 2 (1.88 m)   Wt (!) 301 lb 12.8 oz (136.9 kg)   BMI 38.75 kg/m  GENERAL: The patient is AO x3, in no acute distress. HEENT: Head is normocephalic and atraumatic. EOMI are intact. Mouth is well hydrated and without lesions. NECK: Supple. No masses LUNGS: Clear to auscultation. No presence of rhonchi/wheezing/rales. Adequate chest expansion HEART: RRR, normal s1 and s2. ABDOMEN: Soft, nontender, no guarding, no peritoneal signs, and nondistended. BS +. No masses. RECTAL EXAM: no external lesions, Unable to tolerate complete exam due to pain , no masses, brown stool without blood.  Imaging/Labs: as above     Latest Ref Rng & Units 07/14/2023    3:54 PM  CBC  WBC 4.0 - 10.5 K/uL 71.0   Hemoglobin 13.0 - 17.0 g/dL 89.4   Hematocrit 60.9 - 52.0 % 33.1   Platelets 150 - 400 K/uL 59    No results found for: IRON, TIBC, FERRITIN  I personally reviewed and interpreted the available labs, imaging and endoscopic files.  Impression and Plan:  Austin Coffey is a 58 y.o. male with history of CKD, HTN, glaucoma, recent  acute myeloid leukemia s/p chemo  presented for evaluation of anal fissure .  #Anal fissure #Intermittent constipation   Rectal exam performed today was limited due to patient discomfort during the exam.  On limited exam no mass, fluctuance, blood was noticed  Clinically this appears to be anal fissure as there is pain on defecation.  Denies any hematochezia  Recommendation   Ensure adequate fluid intake: Aim for 8 glasses of water  daily. Follow a high fiber diet: Include foods such as dates, prunes, pears, and kiwi. Take Miralax  twice a day for the first week, then reduce to once daily thereafter. Use Metamucil twice a day. Sitz Bath  10-15 min few times a day Lidocaine  and Nifedipine cream  If persist more than 6-8 weeks despite conservative therapy is considered chronic : and that time would benefit referral to Colorectal surgeon for botulism, or LIS ( Lateral internal sphincyterotomy)   #Surveillance colonoscopy   Last colonoscopy 2020 with tubular adenomas and suggested repeat 3 years.  Patient is overdue for colonoscopy.  He is recently in remission from leukemia.  Would schedule colonoscopy  in 3 months  All questions were answered.      Jennifier Smitherman Faizan Yuridia Couts, MD Gastroenterology and Hepatology [facility]astro[facility]   This chart has been completed using [facility] Dictation software, and while attempts have been made to ensure accuracy , certain words and phrases may not be transcribed as intended

## 2024-01-19 ENCOUNTER — Encounter (INDEPENDENT_AMBULATORY_CARE_PROVIDER_SITE_OTHER): Payer: Self-pay | Admitting: *Deleted

## 2024-03-11 DEATH — deceased
